# Patient Record
Sex: Female | Born: 1935 | Race: White | Hispanic: No | Smoking: Former smoker
Health system: Southern US, Community
[De-identification: ages and names within clinical notes are randomized; demographics above are authoritative.]

## PROBLEM LIST (undated history)

## (undated) DIAGNOSIS — I2489 Other forms of acute ischemic heart disease: Secondary | ICD-10-CM

## (undated) DIAGNOSIS — E7800 Pure hypercholesterolemia, unspecified: Secondary | ICD-10-CM

## (undated) DIAGNOSIS — I951 Orthostatic hypotension: Secondary | ICD-10-CM

## (undated) DIAGNOSIS — G629 Polyneuropathy, unspecified: Secondary | ICD-10-CM

## (undated) DIAGNOSIS — D649 Anemia, unspecified: Secondary | ICD-10-CM

## (undated) DIAGNOSIS — E785 Hyperlipidemia, unspecified: Secondary | ICD-10-CM

## (undated) DIAGNOSIS — I10 Essential (primary) hypertension: Secondary | ICD-10-CM

## (undated) DIAGNOSIS — I509 Heart failure, unspecified: Secondary | ICD-10-CM

## (undated) DIAGNOSIS — I248 Other forms of acute ischemic heart disease: Secondary | ICD-10-CM

## (undated) DIAGNOSIS — E876 Hypokalemia: Secondary | ICD-10-CM

## (undated) DIAGNOSIS — M810 Age-related osteoporosis without current pathological fracture: Secondary | ICD-10-CM

## (undated) HISTORY — PX: TONSILLECTOMY: SUR1361

## (undated) HISTORY — DX: Other forms of acute ischemic heart disease: I24.8

## (undated) HISTORY — DX: Hyperlipidemia, unspecified: E78.5

## (undated) HISTORY — DX: Polyneuropathy, unspecified: G62.9

## (undated) HISTORY — DX: Orthostatic hypotension: I95.1

## (undated) HISTORY — DX: Hypokalemia: E87.6

## (undated) HISTORY — DX: Age-related osteoporosis without current pathological fracture: M81.0

## (undated) HISTORY — DX: Essential (primary) hypertension: I10

## (undated) HISTORY — DX: Pure hypercholesterolemia, unspecified: E78.00

## (undated) HISTORY — DX: Other forms of acute ischemic heart disease: I24.89

## (undated) HISTORY — DX: Anemia, unspecified: D64.9

---

## 1998-05-22 ENCOUNTER — Other Ambulatory Visit: Admission: RE | Admit: 1998-05-22 | Discharge: 1998-05-22 | Payer: Self-pay | Admitting: *Deleted

## 1998-11-05 ENCOUNTER — Encounter: Payer: Self-pay | Admitting: *Deleted

## 1998-11-05 ENCOUNTER — Ambulatory Visit (HOSPITAL_COMMUNITY): Admission: RE | Admit: 1998-11-05 | Discharge: 1998-11-05 | Payer: Self-pay | Admitting: *Deleted

## 1999-06-01 ENCOUNTER — Other Ambulatory Visit: Admission: RE | Admit: 1999-06-01 | Discharge: 1999-06-01 | Payer: Self-pay | Admitting: *Deleted

## 2000-01-27 ENCOUNTER — Encounter: Payer: Self-pay | Admitting: *Deleted

## 2000-01-27 ENCOUNTER — Ambulatory Visit (HOSPITAL_COMMUNITY): Admission: RE | Admit: 2000-01-27 | Discharge: 2000-01-27 | Payer: Self-pay | Admitting: *Deleted

## 2001-05-03 ENCOUNTER — Encounter: Payer: Self-pay | Admitting: *Deleted

## 2001-05-04 ENCOUNTER — Ambulatory Visit (HOSPITAL_COMMUNITY): Admission: RE | Admit: 2001-05-04 | Discharge: 2001-05-04 | Payer: Self-pay | Admitting: *Deleted

## 2003-01-10 ENCOUNTER — Ambulatory Visit (HOSPITAL_COMMUNITY): Admission: RE | Admit: 2003-01-10 | Discharge: 2003-01-10 | Payer: Self-pay | Admitting: *Deleted

## 2003-01-10 ENCOUNTER — Encounter: Payer: Self-pay | Admitting: *Deleted

## 2005-10-26 ENCOUNTER — Encounter: Admission: RE | Admit: 2005-10-26 | Discharge: 2005-10-26 | Payer: Self-pay | Admitting: *Deleted

## 2013-09-22 ENCOUNTER — Observation Stay (HOSPITAL_COMMUNITY)
Admission: EM | Admit: 2013-09-22 | Discharge: 2013-09-25 | Disposition: A | Discharge status: Home / Self Care | Payer: PRIVATE HEALTH INSURANCE | Attending: Internal Medicine | Admitting: Internal Medicine

## 2013-09-22 ENCOUNTER — Emergency Department (HOSPITAL_COMMUNITY): Payer: PRIVATE HEALTH INSURANCE

## 2013-09-22 ENCOUNTER — Encounter (HOSPITAL_COMMUNITY): Payer: Self-pay | Admitting: Emergency Medicine

## 2013-09-22 DIAGNOSIS — R079 Chest pain, unspecified: Secondary | ICD-10-CM | POA: Insufficient documentation

## 2013-09-22 DIAGNOSIS — I5043 Acute on chronic combined systolic (congestive) and diastolic (congestive) heart failure: Secondary | ICD-10-CM | POA: Insufficient documentation

## 2013-09-22 DIAGNOSIS — R05 Cough: Secondary | ICD-10-CM | POA: Insufficient documentation

## 2013-09-22 DIAGNOSIS — R509 Fever, unspecified: Secondary | ICD-10-CM | POA: Diagnosis present

## 2013-09-22 DIAGNOSIS — I080 Rheumatic disorders of both mitral and aortic valves: Secondary | ICD-10-CM | POA: Insufficient documentation

## 2013-09-22 DIAGNOSIS — E876 Hypokalemia: Secondary | ICD-10-CM | POA: Diagnosis not present

## 2013-09-22 DIAGNOSIS — R059 Cough, unspecified: Secondary | ICD-10-CM | POA: Insufficient documentation

## 2013-09-22 DIAGNOSIS — Z87891 Personal history of nicotine dependence: Secondary | ICD-10-CM | POA: Insufficient documentation

## 2013-09-22 DIAGNOSIS — J111 Influenza due to unidentified influenza virus with other respiratory manifestations: Secondary | ICD-10-CM

## 2013-09-22 DIAGNOSIS — I509 Heart failure, unspecified: Secondary | ICD-10-CM

## 2013-09-22 DIAGNOSIS — E860 Dehydration: Secondary | ICD-10-CM

## 2013-09-22 DIAGNOSIS — I951 Orthostatic hypotension: Secondary | ICD-10-CM | POA: Diagnosis not present

## 2013-09-22 DIAGNOSIS — I248 Other forms of acute ischemic heart disease: Secondary | ICD-10-CM | POA: Diagnosis not present

## 2013-09-22 DIAGNOSIS — I5042 Chronic combined systolic (congestive) and diastolic (congestive) heart failure: Secondary | ICD-10-CM

## 2013-09-22 DIAGNOSIS — D649 Anemia, unspecified: Secondary | ICD-10-CM | POA: Diagnosis not present

## 2013-09-22 DIAGNOSIS — J09X1 Influenza due to identified novel influenza A virus with pneumonia: Secondary | ICD-10-CM

## 2013-09-22 DIAGNOSIS — R748 Abnormal levels of other serum enzymes: Secondary | ICD-10-CM | POA: Insufficient documentation

## 2013-09-22 DIAGNOSIS — I2489 Other forms of acute ischemic heart disease: Secondary | ICD-10-CM

## 2013-09-22 DIAGNOSIS — I700 Atherosclerosis of aorta: Secondary | ICD-10-CM | POA: Insufficient documentation

## 2013-09-22 HISTORY — DX: Heart failure, unspecified: I50.9

## 2013-09-22 LAB — CBC WITH DIFFERENTIAL/PLATELET
Basophils Absolute: 0 K/uL (ref 0.0–0.1)
Basophils Relative: 0 % (ref 0–1)
Eosinophils Absolute: 0 10*3/uL (ref 0.0–0.7)
Eosinophils Relative: 0 % (ref 0–5)
HCT: 32.6 % — ABNORMAL LOW (ref 36.0–46.0)
Hemoglobin: 10.8 g/dL — ABNORMAL LOW (ref 12.0–15.0)
Lymphocytes Relative: 6 % — ABNORMAL LOW (ref 12–46)
Lymphs Abs: 0.3 10*3/uL — ABNORMAL LOW (ref 0.7–4.0)
MCH: 30.4 pg (ref 26.0–34.0)
MCHC: 33.1 g/dL (ref 30.0–36.0)
MCV: 91.8 fL (ref 78.0–100.0)
Monocytes Absolute: 0.7 10*3/uL (ref 0.1–1.0)
Monocytes Relative: 13 % — ABNORMAL HIGH (ref 3–12)
Neutro Abs: 4.1 K/uL (ref 1.7–7.7)
Neutrophils Relative %: 81 % — ABNORMAL HIGH (ref 43–77)
Platelets: 158 10*3/uL (ref 150–400)
RBC: 3.55 MIL/uL — ABNORMAL LOW (ref 3.87–5.11)
RDW: 13.8 % (ref 11.5–15.5)
WBC: 5 K/uL (ref 4.0–10.5)

## 2013-09-22 LAB — URINALYSIS, ROUTINE W REFLEX MICROSCOPIC
Bilirubin Urine: NEGATIVE
Glucose, UA: NEGATIVE mg/dL
Hgb urine dipstick: NEGATIVE
Ketones, ur: NEGATIVE mg/dL
Leukocytes, UA: NEGATIVE
Nitrite: NEGATIVE
Protein, ur: NEGATIVE mg/dL
Specific Gravity, Urine: 1.014 (ref 1.005–1.030)
Urobilinogen, UA: 0.2 mg/dL (ref 0.0–1.0)
pH: 6 (ref 5.0–8.0)

## 2013-09-22 LAB — COMPREHENSIVE METABOLIC PANEL WITH GFR
AST: 14 U/L (ref 0–37)
Albumin: 3 g/dL — ABNORMAL LOW (ref 3.5–5.2)
CO2: 29 meq/L (ref 19–32)
Chloride: 98 meq/L (ref 96–112)
Sodium: 137 meq/L (ref 135–145)
Total Bilirubin: 0.4 mg/dL (ref 0.3–1.2)

## 2013-09-22 LAB — COMPREHENSIVE METABOLIC PANEL
ALT: 5 U/L (ref 0–35)
Alkaline Phosphatase: 61 U/L (ref 39–117)
BUN: 19 mg/dL (ref 6–23)
Calcium: 8.4 mg/dL (ref 8.4–10.5)
Creatinine, Ser: 0.98 mg/dL (ref 0.50–1.10)
GFR calc Af Amer: 56 mL/min — ABNORMAL LOW (ref >90)
GFR calc non Af Amer: 49 mL/min — ABNORMAL LOW (ref >90)
Glucose, Bld: 110 mg/dL — ABNORMAL HIGH (ref 70–99)
Potassium: 3.2 mEq/L — ABNORMAL LOW (ref 3.5–5.1)
Total Protein: 6.3 g/dL (ref 6.0–8.3)

## 2013-09-22 LAB — CG4 I-STAT (LACTIC ACID): Lactic Acid, Venous: 0.73 mmol/L (ref 0.5–2.2)

## 2013-09-22 LAB — PRO B NATRIURETIC PEPTIDE: Pro B Natriuretic peptide (BNP): 17493 pg/mL — ABNORMAL HIGH (ref 0–450)

## 2013-09-22 MED ORDER — SODIUM CHLORIDE 0.9 % IV BOLUS (SEPSIS)
250.0000 mL | Freq: Once | INTRAVENOUS | Status: AC
  Administered 2013-09-22: 250 mL via INTRAVENOUS

## 2013-09-22 MED ORDER — SODIUM CHLORIDE 0.9 % IV BOLUS (SEPSIS)
500.0000 mL | Freq: Once | INTRAVENOUS | Status: AC
  Administered 2013-09-22: 500 mL via INTRAVENOUS

## 2013-09-22 MED ORDER — IPRATROPIUM BROMIDE 0.02 % IN SOLN
0.5000 mg | Freq: Once | RESPIRATORY_TRACT | Status: AC
  Administered 2013-09-22: 0.5 mg via RESPIRATORY_TRACT
  Filled 2013-09-22: qty 2.5

## 2013-09-22 MED ORDER — ALBUTEROL (5 MG/ML) CONTINUOUS INHALATION SOLN
10.0000 mg/h | INHALATION_SOLUTION | Freq: Once | RESPIRATORY_TRACT | Status: AC
Start: 2013-09-22 — End: 2013-09-22
  Administered 2013-09-22: 10 mg/h via RESPIRATORY_TRACT
  Filled 2013-09-22: qty 20

## 2013-09-22 MED ORDER — ACETAMINOPHEN 325 MG PO TABS
650.0000 mg | ORAL_TABLET | Freq: Once | ORAL | Status: AC
  Administered 2013-09-22: 650 mg via ORAL
  Filled 2013-09-22: qty 2

## 2013-09-22 NOTE — ED Provider Notes (Addendum)
CSN: [id number]     Arrival date & time 09/22/13  2000 History   First MD Initiated Contact with Patient 09/22/13 2040     Chief Complaint  Patient presents with  . Weakness  . Fever  . Cough   (Consider location/radiation/quality/duration/timing/severity/associated sxs/prior Treatment) Patient is a 79 y.o. female presenting with weakness, fever, and cough. The history is provided by the patient, a caregiver and the EMS personnel.  Weakness This is a new problem. The current episode started 3 to 5 hours ago. The problem occurs constantly. The problem has been gradually improving. Pertinent negatives include no chest pain, no abdominal pain, no headaches and no shortness of breath.  Fever Max temp prior to arrival:  102 Temp source:  Oral Severity:  Moderate Onset quality:  Sudden Duration:  4 hours Timing:  Constant Chronicity:  New Relieved by:  Acetaminophen Worsened by:  Nothing tried Associated symptoms: chills and cough   Associated symptoms: no chest pain, no congestion, no diarrhea, no headaches, no myalgias, no nausea, no rash, no sore throat and no vomiting   Cough:    Cough characteristics:  Non-productive and hacking   Sputum characteristics:  Nondescript   Severity:  Moderate   Onset quality:  Gradual   Duration:  1 day   Timing:  Constant   Chronicity:  New Risk factors: sick contacts   Risk factors: no immunosuppression   Cough Associated symptoms: chills and fever   Associated symptoms: no chest pain, no diaphoresis, no headaches, no myalgias, no rash, no shortness of breath and no sore throat     Past Medical History  Diagnosis Date  . CHF (congestive heart failure)    History reviewed. No pertinent past surgical history. History reviewed. No pertinent family history. History  Substance Use Topics  . Smoking status: Former Games developer  . Smokeless tobacco: Not on file  . Alcohol Use: No   OB History   Grav Para Term Preterm Abortions TAB SAB Ect Mult  Living                 Review of Systems  Constitutional: Positive for fever, chills and fatigue. Negative for diaphoresis.  HENT: Positive for sneezing. Negative for congestion and sore throat.   Respiratory: Positive for cough. Negative for shortness of breath.   Cardiovascular: Negative for chest pain.  Gastrointestinal: Negative for nausea, vomiting, abdominal pain and diarrhea.  Musculoskeletal: Negative for myalgias and neck pain.  Skin: Negative for rash.  Neurological: Positive for weakness. Negative for syncope and headaches.    Allergies  Review of patient's allergies indicates no known allergies.  Home Medications   Current Outpatient Rx  Name  Route  Sig  Dispense  Refill  . aspirin EC 81 MG tablet   Oral   Take 81 mg by mouth daily.         . cephALEXin (KEFLEX) 250 MG capsule   Oral   Take 250 mg by mouth at bedtime.          . cholecalciferol (VITAMIN D) 1000 UNITS tablet   Oral   Take 1,000 Units by mouth daily.         . dorzolamide-timolol (COSOPT) 22.3-6.8 MG/ML ophthalmic solution   Both Eyes   Place 1 drop into both eyes 2 (two) times daily.         . furosemide (LASIX) 20 MG tablet   Oral   Take 60 mg by mouth daily.         Marland Kitchen  potassium chloride 20 MEQ/15ML (10%) solution   Oral   Take 20 mEq by mouth daily.          BP 67/37  Pulse 133  Temp(Src) 98.1 F (36.7 C) (Oral)  Resp 23  Ht 5\' 1"  (1.549 m)  Wt 111 lb (50.349 kg)  BMI 20.98 kg/m2  SpO2 90% Physical Exam  Nursing note and vitals reviewed. Constitutional: She appears well-developed and well-nourished. No distress.  HENT:  Head: Atraumatic.  Nose: Nose normal.  Mouth/Throat: Oropharynx is clear and moist. No oropharyngeal exudate.  Eyes: Conjunctivae and EOM are normal.  Neck: Normal range of motion. Neck supple.  Cardiovascular: Normal rate, regular rhythm and intact distal pulses.   Pulmonary/Chest: Effort normal. No respiratory distress. She has no wheezes.  She has no rales.  Abdominal: Soft. She exhibits no distension. There is no tenderness. There is no rebound and no guarding.  Lymphadenopathy:    She has no cervical adenopathy.  Neurological: She is alert. She exhibits normal muscle tone. Coordination normal.  Skin: Skin is warm and dry. No rash noted. She is not diaphoretic.  Psychiatric: She has a normal mood and affect. Her behavior is normal. Thought content normal. She exhibits abnormal recent memory.    ED Course  Procedures (including critical care time) Labs Review Labs Reviewed  CBC WITH DIFFERENTIAL - Abnormal; Notable for the following:    RBC 3.55 (*)    Hemoglobin 10.8 (*)    HCT 32.6 (*)    Neutrophils Relative % 81 (*)    Lymphocytes Relative 6 (*)    Lymphs Abs 0.3 (*)    Monocytes Relative 13 (*)    All other components within normal limits  COMPREHENSIVE METABOLIC PANEL - Abnormal; Notable for the following:    Potassium 3.2 (*)    Glucose, Bld 110 (*)    Albumin 3.0 (*)    GFR calc non Af Amer 49 (*)    GFR calc Af Amer 56 (*)    All other components within normal limits  PRO B NATRIURETIC PEPTIDE - Abnormal; Notable for the following:    Pro B Natriuretic peptide (BNP) 17493.0 (*)    All other components within normal limits  URINE CULTURE  CULTURE, BLOOD (ROUTINE X 2)  CULTURE, BLOOD (ROUTINE X 2)  URINALYSIS, ROUTINE W REFLEX MICROSCOPIC  INFLUENZA PANEL BY PCR  CG4 I-STAT (LACTIC ACID)   Imaging Review Dg Chest Port 1 View  09/22/2013   CLINICAL DATA:  Cough and fever.  EXAM: PORTABLE CHEST - 1 VIEW  COMPARISON:  05/17/2013  FINDINGS: Normal heart size and pulmonary vascularity. Diffuse interstitial fibrosis and bronchiectasis. Calcification and pleural thickening in the apices. Findings appear similar to previous study in suggest chronic fibrosis. No focal airspace disease or consolidation. No blunting of costophrenic angles. No pneumothorax. Calcification of the aorta.  IMPRESSION: Stable appearance  of chronic fibrosis and bronchiectasis in the lungs. No superimposed acute infiltration.   Electronically Signed   By: Burman Nieves M.D.   On: 09/22/2013 21:23    EKG Interpretation   At time 20:21, sinus rhythm at rate 96, LBBB, right atrial enlargement.  Abn ECG.        RA sat is 90% and I interpret to be inadequate, with [place] O2, increased to 95%.  11:09 PM RA sat continues to be low, HR is now in the 120's, could be due to albuterol use, however wide PP with systolic 108, diastolic 34.  Will give additional IVF  bolus and reassess.  Pt's BNP is elevated, hwoever no frank edema on CXR.  Given continued abn VS, likely pt will need admission.  11:21 PM With orthostatic testing, pt is hypotensive, still tachycardic, will need continued gently IVF hydration, O2 supplement, admission.  Influenza PCR sent, will discuss with [facility].  11:37 PM Pt has a living will, pt does not wish defib, CPR, intubation.  MDM   1. Dehydration   2. Influenza-like illness   3. CHF (congestive heart failure)   4. Orthostatic hypotension      Pt with fever, rather sudden onset, profound weakness and coughing, some features consistent with influenza.  Pt has a h/o CHF, no DM.  Pt quit smoking many years ago.  With mild hypoexmia, will get CXR< blood cultures, BNP and treat with nebulizer treatmnet and continue to monitor.  Will watch BP carefully along with lactic acid to monitor for worsening sepsis.  Pt meets SIRS criteria.      Gavin Pound. Oletta Lamas, MD 09/22/13 2322  Gavin Pound. Oletta Lamas, MD 09/22/13 2337

## 2013-09-22 NOTE — ED Notes (Signed)
Bed: RESB Expected date: 09/22/13 Expected time: 7:53 PM Means of arrival: Ambulance Comments: 79 yr old fever

## 2013-09-22 NOTE — ED Notes (Signed)
Pt arrived to the ED with a complaint of generalized weakness and fever.  Pt is also complaining of a cough.  Pt is from home.  Pt has had a fever but tylenol and motrin has not been consistently given.  Per EMS her blood pressure was WNL supine but when she was sat up she became orthostatic and blood pressure fell into the 70's palpated.  Pt has been given 250 mL of NS in route.

## 2013-09-23 ENCOUNTER — Encounter (HOSPITAL_COMMUNITY): Payer: Self-pay | Admitting: Internal Medicine

## 2013-09-23 ENCOUNTER — Inpatient Hospital Stay (HOSPITAL_COMMUNITY): Payer: PRIVATE HEALTH INSURANCE

## 2013-09-23 DIAGNOSIS — E860 Dehydration: Secondary | ICD-10-CM

## 2013-09-23 DIAGNOSIS — I509 Heart failure, unspecified: Secondary | ICD-10-CM

## 2013-09-23 DIAGNOSIS — I951 Orthostatic hypotension: Secondary | ICD-10-CM | POA: Diagnosis present

## 2013-09-23 DIAGNOSIS — J09X1 Influenza due to identified novel influenza A virus with pneumonia: Secondary | ICD-10-CM | POA: Diagnosis not present

## 2013-09-23 DIAGNOSIS — R509 Fever, unspecified: Secondary | ICD-10-CM | POA: Diagnosis present

## 2013-09-23 DIAGNOSIS — E876 Hypokalemia: Secondary | ICD-10-CM | POA: Diagnosis present

## 2013-09-23 DIAGNOSIS — D649 Anemia, unspecified: Secondary | ICD-10-CM | POA: Diagnosis present

## 2013-09-23 LAB — CREATININE, SERUM
Creatinine, Ser: 1 mg/dL (ref 0.50–1.10)
GFR calc Af Amer: 55 mL/min — ABNORMAL LOW (ref >90)
GFR calc non Af Amer: 47 mL/min — ABNORMAL LOW (ref >90)

## 2013-09-23 LAB — TROPONIN I
Troponin I: <0.3 ng/mL (ref <0.30)
Troponin I: <0.3 ng/mL (ref <0.30)
Troponin I: 0.51 ng/mL (ref <0.30)
Troponin I: 1.46 ng/mL (ref <0.30)

## 2013-09-23 LAB — CBC
HCT: 34.4 % — ABNORMAL LOW (ref 36.0–46.0)
Hemoglobin: 11.3 g/dL — ABNORMAL LOW (ref 12.0–15.0)
RBC: 3.73 MIL/uL — ABNORMAL LOW (ref 3.87–5.11)
WBC: 6.1 10*3/uL (ref 4.0–10.5)

## 2013-09-23 LAB — COMPREHENSIVE METABOLIC PANEL
ALT: 6 U/L (ref 0–35)
Albumin: 3.1 g/dL — ABNORMAL LOW (ref 3.5–5.2)
BUN: 20 mg/dL (ref 6–23)
Calcium: 8.4 mg/dL (ref 8.4–10.5)
Creatinine, Ser: 0.92 mg/dL (ref 0.50–1.10)
GFR calc Af Amer: 61 mL/min — ABNORMAL LOW (ref >90)
GFR calc non Af Amer: 52 mL/min — ABNORMAL LOW (ref >90)
Glucose, Bld: 105 mg/dL — ABNORMAL HIGH (ref 70–99)
Sodium: 135 mEq/L (ref 135–145)
Total Protein: 6.6 g/dL (ref 6.0–8.3)

## 2013-09-23 LAB — PHOSPHORUS: Phosphorus: 4 mg/dL (ref 2.3–4.6)

## 2013-09-23 LAB — RETICULOCYTES
Retic Count, Absolute: 28 10*3/uL (ref 19.0–186.0)
Retic Ct Pct: 0.8 % (ref 0.4–3.1)

## 2013-09-23 LAB — MAGNESIUM: Magnesium: 1.9 mg/dL (ref 1.5–2.5)

## 2013-09-23 LAB — INFLUENZA PANEL BY PCR (TYPE A & B): Influenza A By PCR: POSITIVE — AB

## 2013-09-23 LAB — FERRITIN: Ferritin: 86 ng/mL (ref 10–291)

## 2013-09-23 LAB — IRON AND TIBC: UIBC: 203 ug/dL (ref 125–400)

## 2013-09-23 MED ORDER — POTASSIUM CHLORIDE 20 MEQ/15ML (10%) PO LIQD
20.0000 meq | Freq: Every day | ORAL | Status: DC
  Administered 2013-09-23 – 2013-09-25 (×3): 20 meq via ORAL
  Filled 2013-09-23 (×3): qty 15

## 2013-09-23 MED ORDER — ONDANSETRON HCL 4 MG/2ML IJ SOLN
4.0000 mg | Freq: Four times a day (QID) | INTRAMUSCULAR | Status: DC | PRN
  Administered 2013-09-23: 07:00:00 4 mg via INTRAVENOUS
  Filled 2013-09-23: qty 2

## 2013-09-23 MED ORDER — ACETAMINOPHEN 325 MG PO TABS
650.0000 mg | ORAL_TABLET | Freq: Four times a day (QID) | ORAL | Status: DC | PRN
  Administered 2013-09-23: 650 mg via ORAL
  Filled 2013-09-23: qty 2

## 2013-09-23 MED ORDER — SODIUM CHLORIDE 0.9 % IV SOLN: Freq: Once | INTRAVENOUS | Status: DC

## 2013-09-23 MED ORDER — ACETAMINOPHEN 650 MG RE SUPP: 650.0000 mg | Freq: Four times a day (QID) | RECTAL | Status: DC | PRN

## 2013-09-23 MED ORDER — HYDROCODONE-ACETAMINOPHEN 5-325 MG PO TABS: 1.0000 | ORAL_TABLET | ORAL | Status: DC | PRN

## 2013-09-23 MED ORDER — ENOXAPARIN SODIUM 30 MG/0.3ML ~~LOC~~ SOLN
30.0000 mg | SUBCUTANEOUS | Status: DC
  Administered 2013-09-23 – 2013-09-24 (×2): 30 mg via SUBCUTANEOUS
  Filled 2013-09-23 (×3): qty 0.3

## 2013-09-23 MED ORDER — POTASSIUM CHLORIDE CRYS ER 20 MEQ PO TBCR
20.0000 meq | EXTENDED_RELEASE_TABLET | Freq: Once | ORAL | Status: AC
  Administered 2013-09-23: 20 meq via ORAL
  Filled 2013-09-23: qty 1

## 2013-09-23 MED ORDER — ENSURE COMPLETE PO LIQD: 237.0000 mL | Freq: Two times a day (BID) | ORAL | Status: DC | PRN

## 2013-09-23 MED ORDER — ASPIRIN EC 81 MG PO TBEC
81.0000 mg | DELAYED_RELEASE_TABLET | Freq: Every day | ORAL | Status: DC
  Administered 2013-09-23 – 2013-09-25 (×3): 81 mg via ORAL
  Filled 2013-09-23 (×3): qty 1

## 2013-09-23 MED ORDER — SODIUM CHLORIDE 0.9 % IV SOLN
25.0000 mg | Freq: Once | INTRAVENOUS | Status: DC
  Filled 2013-09-23: qty 0.5

## 2013-09-23 MED ORDER — SODIUM CHLORIDE 0.9 % IV SOLN
50.0000 mg | Freq: Once | INTRAVENOUS | Status: DC
  Filled 2013-09-23: qty 1

## 2013-09-23 MED ORDER — SODIUM CHLORIDE 0.9 % IV SOLN: INTRAVENOUS | Status: DC

## 2013-09-23 MED ORDER — PANTOPRAZOLE SODIUM 40 MG PO TBEC
40.0000 mg | DELAYED_RELEASE_TABLET | Freq: Every day | ORAL | Status: DC
  Administered 2013-09-23 – 2013-09-25 (×3): 40 mg via ORAL
  Filled 2013-09-23 (×4): qty 1

## 2013-09-23 MED ORDER — ONDANSETRON HCL 4 MG PO TABS: 4.0000 mg | ORAL_TABLET | Freq: Four times a day (QID) | ORAL | Status: DC | PRN

## 2013-09-23 MED ORDER — CEPHALEXIN 250 MG PO CAPS
250.0000 mg | ORAL_CAPSULE | Freq: Every day | ORAL | Status: DC
  Administered 2013-09-23 – 2013-09-24 (×3): 250 mg via ORAL
  Filled 2013-09-23 (×4): qty 1

## 2013-09-23 MED ORDER — OSELTAMIVIR PHOSPHATE 75 MG PO CAPS
75.0000 mg | ORAL_CAPSULE | Freq: Two times a day (BID) | ORAL | Status: DC
  Administered 2013-09-23 (×2): 75 mg via ORAL
  Filled 2013-09-23 (×3): qty 1

## 2013-09-23 MED ORDER — FUROSEMIDE 10 MG/ML IJ SOLN: 20.0000 mg | Freq: Once | INTRAMUSCULAR | Status: DC

## 2013-09-23 MED ORDER — DORZOLAMIDE HCL-TIMOLOL MAL 2-0.5 % OP SOLN
1.0000 [drp] | Freq: Two times a day (BID) | OPHTHALMIC | Status: DC
  Administered 2013-09-23 – 2013-09-25 (×5): 1 [drp] via OPHTHALMIC
  Filled 2013-09-23: qty 10

## 2013-09-23 MED ORDER — DOCUSATE SODIUM 100 MG PO CAPS
100.0000 mg | ORAL_CAPSULE | Freq: Two times a day (BID) | ORAL | Status: DC
  Administered 2013-09-23 – 2013-09-25 (×5): 100 mg via ORAL
  Filled 2013-09-23 (×6): qty 1

## 2013-09-23 MED ORDER — POTASSIUM CHLORIDE CRYS ER 20 MEQ PO TBCR
20.0000 meq | EXTENDED_RELEASE_TABLET | Freq: Two times a day (BID) | ORAL | Status: DC
  Filled 2013-09-23 (×2): qty 1

## 2013-09-23 MED ORDER — SODIUM CHLORIDE 0.9 % IJ SOLN
3.0000 mL | Freq: Two times a day (BID) | INTRAMUSCULAR | Status: DC
  Administered 2013-09-23 – 2013-09-24 (×3): 3 mL via INTRAVENOUS

## 2013-09-23 MED ORDER — OSELTAMIVIR PHOSPHATE 75 MG PO CAPS
75.0000 mg | ORAL_CAPSULE | Freq: Every day | ORAL | Status: DC
  Administered 2013-09-24: 75 mg via ORAL
  Filled 2013-09-23: qty 1

## 2013-09-23 MED ORDER — SODIUM CHLORIDE 0.9 % IV BOLUS (SEPSIS)
250.0000 mL | Freq: Once | INTRAVENOUS | Status: AC
  Administered 2013-09-23: 22:00:00 250 mL via INTRAVENOUS

## 2013-09-23 NOTE — Consult Note (Signed)
Cardiology Consultation Note  Patient ID: Yolanda Cannon, MRN: [id number], DOB/AGE: Dec 31, 1918 79 y.o. Admit date: 09/22/2013   Date of Consult: 09/23/2013 Primary Physician: Gretel Acre, MD Primary Cardiologist: New to [facility]  Chief Complaint: fever, weakness Reason for Consult: elevated troponin, CHF  HPI:Yolanda Cannon is a 79 y/o F with reported history of prior CHF who presented to [facility] last night with progressive fatigue, cough, and fever up to 100.3. She was positive for the flu A. pBNP was 17k on admission but she was actually felt to be on the dry side with severe orthostasis on admission (BP fell to the 70's per EMS when sitting up). She received gentle IV hydration. Initial troponin neg x2 then 3rd set 0.51. CXR suggestive of new changes in the left base likely related to acute superimposed infiltrate. She was started on Tamiflu and supplemental oxygen. She did receive the flu shot this year.  She has been documented to be quite orthostatic upon standing.  Past Medical History  Diagnosis Date  . CHF (congestive heart failure)       Most Recent Cardiac Studies: No prior   Surgical History: History reviewed. No pertinent past surgical history.   Home Meds: Prior to Admission medications   Medication Sig Start Date End Date Taking? Authorizing Provider  aspirin EC 81 MG tablet Take 81 mg by mouth daily.   Yes Historical Provider, MD  cephALEXin (KEFLEX) 250 MG capsule Take 250 mg by mouth at bedtime.  09/20/13  Yes Historical Provider, MD  cholecalciferol (VITAMIN D) 1000 UNITS tablet Take 1,000 Units by mouth daily.   Yes Historical Provider, MD  dorzolamide-timolol (COSOPT) 22.3-6.8 MG/ML ophthalmic solution Place 1 drop into both eyes 2 (two) times daily.   Yes Historical Provider, MD  furosemide (LASIX) 20 MG tablet Take 60 mg by mouth daily.   Yes Historical Provider, MD  potassium chloride 20 MEQ/15ML (10%) solution Take 20 mEq by mouth daily.   Yes Historical Provider,  MD    Inpatient Medications:  . sodium chloride   Intravenous Once  . aspirin EC  81 mg Oral Daily  . cephALEXin  250 mg Oral QHS  . docusate sodium  100 mg Oral BID  . dorzolamide-timolol  1 drop Both Eyes BID  . enoxaparin (LOVENOX) injection  30 mg Subcutaneous Q24H  . furosemide  20 mg Intravenous Once  . [START ON 09/24/2013] oseltamivir  75 mg Oral Daily  . pantoprazole  40 mg Oral Q0600  . potassium chloride  20 mEq Oral Daily  . sodium chloride  3 mL Intravenous Q12H      Allergies: No Known Allergies  History   Social History  . Marital Status: Widowed    Spouse Name: N/A    Number of Children: N/A  . Years of Education: N/A   Occupational History  . Not on file.   Social History Main Topics  . Smoking status: Former Games developer  . Smokeless tobacco: Not on file  . Alcohol Use: No  . Drug Use: No  . Sexual Activity: No   Other Topics Concern  . Not on file   Social History Narrative  . No narrative on file     Family History  Problem Relation Age of Onset  . Other      family history unknown to patient     Review of Systems:  Sleeping and rouses but is unable to provide ROS  Labs:  Recent Labs  09/23/13 0119 09/23/13 6295  09/23/13 1315  TROPONINI <0.30 <0.30 0.51*   Lab Results  Component Value Date   WBC 6.1 09/23/2013   HGB 11.3* 09/23/2013   HCT 34.4* 09/23/2013   MCV 92.2 09/23/2013   PLT 155 09/23/2013    Recent Labs Lab 09/23/13 0723  NA 135  K 3.2*  CL 97  CO2 27  BUN 20  CREATININE 0.92  CALCIUM 8.4  PROT 6.6  BILITOT 0.3  ALKPHOS 64  ALT 6  AST 17  GLUCOSE 105*   No results found for this basename: CHOL, HDL, LDLCALC, TRIG   No results found for this basename: DDIMER    Radiology/Studies:  Dg Chest 2 View  09/23/2013   CLINICAL DATA:  Cough and fever  EXAM: CHEST  2 VIEW  COMPARISON:  09/22/2013  FINDINGS: Cardiac shadow is stable. Diffuse fibrotic changes are again identified. New density is noted in the  left lung base which may represent a very early superimposed infiltrate. No acute bony abnormality is noted.  IMPRESSION: New changes in the left base likely related to acute superimposed infiltrate.   Electronically Signed   By: Alcide Clever M.D.   On: 09/23/2013 07:59   Dg Chest Port 1 View  09/22/2013   CLINICAL DATA:  Cough and fever.  EXAM: PORTABLE CHEST - 1 VIEW  COMPARISON:  05/17/2013  FINDINGS: Normal heart size and pulmonary vascularity. Diffuse interstitial fibrosis and bronchiectasis. Calcification and pleural thickening in the apices. Findings appear similar to previous study in suggest chronic fibrosis. No focal airspace disease or consolidation. No blunting of costophrenic angles. No pneumothorax. Calcification of the aorta.  IMPRESSION: Stable appearance of chronic fibrosis and bronchiectasis in the lungs. No superimposed acute infiltration.   Electronically Signed   By: Burman Nieves M.D.   On: 09/22/2013 21:23   EKG: sinus tach 114bpm LBBB   Physical Exam: Blood pressure 108/51, pulse 101, temperature 99.3 F (37.4 C), temperature source Oral, resp. rate 20, height 5\' 1"  (1.549 m), weight 110 lb 3.7 oz (50 kg), SpO2 90.00%. General: thin frail and elderly, sleeping but rouses Head: Normocephalic, atraumatic, sclera non-icteric, no xanthomas, nares are without discharge.OP is dry  JVP is flat, neck is supple Lungs: Clear bilaterally to auscultation without wheezes,  Heart: RRR with S1 S2. No murmurs, rubs, or gallops appreciated. Abdomen: Soft, non-tender, non-distended with normoactive bowel sounds. No hepatomegaly. No rebound/guarding. No obvious abdominal masses. Msk:  Strength and tone appear normal for age. Extremities: No clubbing or cyanosis. No edema.  Distal pedal pulses are 2+ and equal bilaterally. Neuro:  Moves all extremities spontaneously. Psych:  Sleeping but rouses, mildly confused   Assessment and Plan:   79 yo WF with the flu.  She is orthostatic and dry  on exam.  She carries a h/o CHF but is not volume overloaded on exam.  Her mildly elevated troponin is likely due to "demand ischemia" related to having the flu.  I do not think that she has an acute coronary syndrome and would not advise anticoagulation or further CV testing given her advanced age.  My recs are to treat supportively.  Hold diuretics and gently hydrate until no longer orthostatic. Echo is pending. She is not a candidate for invasive CV procedures.

## 2013-09-23 NOTE — ED Notes (Signed)
Pt's daughter Joanna Puff [phone](U)  [phone] (H) contact information

## 2013-09-23 NOTE — H&P (Addendum)
PCP:  Gretel Acre, MD    Chief Complaint:  Cough and fever  HPI: Yolanda Cannon is a 79 y.o. female   has a past medical history of CHF (congestive heart failure).   Presented with  Patient states that the past 2-3 days she been not feeling well. Have been progressively getting fatigued. She was having a cough and started to have some low-grade fevers as well. Her family apparently brought in to emergency department where she was found to be severely orthostatic. She was febrile to 100.3. Chest x-ray did not show any evidence of pneumonia July unremarkable. Patient did have some elevation of BNP she has chronic history of CHF but did not appear to be fluid overloaded. On the contrary patient appears to be dehydrated. Hospitalist was called on admission. Family not at bedside. Patient states that she feels much better but not back to normal yet She reports having a flu shot done this year. Reports that her son-in-law has been recently ill Review of Systems:    Pertinent positives include:, Fevers, chills, fatigue,   non-productive cough, Constitutional:  No weight loss, night sweatsweight loss  HEENT:  No headaches, Difficulty swallowing,Tooth/dental problems,Sore throat,  No sneezing, itching, ear ache, nasal congestion, post nasal drip,  Cardio-vascular:  No chest pain, Orthopnea, PND, anasarca, dizziness, palpitations.no Bilateral lower extremity swelling  GI:  No heartburn, indigestion, abdominal pain, nausea, vomiting, diarrhea, change in bowel habits, loss of appetite, melena, blood in stool, hematemesis Resp:  no shortness of breath at rest. No dyspnea on exertion, No excess mucus, no productive cough, No No coughing up of blood.No change in color of mucus.No wheezing. Skin:  no rash or lesions. No jaundice GU:  no dysuria, change in color of urine, no urgency or frequency. No straining to urinate.  No flank pain.  Musculoskeletal:  No joint pain or no joint swelling. No  decreased range of motion. No back pain.  Psych:  No change in mood or affect. No depression or anxiety. No memory loss.  Neuro: no localizing neurological complaints, no tingling, no weakness, no double vision, no gait abnormality, no slurred speech, no confusion  Otherwise ROS are negative except for above, 10 systems were reviewed  Past Medical History: Past Medical History  Diagnosis Date  . CHF (congestive heart failure)    History reviewed. No pertinent past surgical history.   Medications: Prior to Admission medications   Medication Sig Start Date End Date Taking? Authorizing Provider  aspirin EC 81 MG tablet Take 81 mg by mouth daily.   Yes Historical Provider, MD  cephALEXin (KEFLEX) 250 MG capsule Take 250 mg by mouth at bedtime.  09/20/13  Yes Historical Provider, MD  cholecalciferol (VITAMIN D) 1000 UNITS tablet Take 1,000 Units by mouth daily.   Yes Historical Provider, MD  dorzolamide-timolol (COSOPT) 22.3-6.8 MG/ML ophthalmic solution Place 1 drop into both eyes 2 (two) times daily.   Yes Historical Provider, MD  furosemide (LASIX) 20 MG tablet Take 60 mg by mouth daily.   Yes Historical Provider, MD  potassium chloride 20 MEQ/15ML (10%) solution Take 20 mEq by mouth daily.   Yes Historical Provider, MD    Allergies:  No Known Allergies  Social History:  Ambulatory walker Lives at  home alone   reports that she has quit smoking. She does not have any smokeless tobacco history on file. She reports that she does not drink alcohol or use illicit drugs.   Family History: Family history is unknown by  patient.    Physical Exam: Patient Vitals for the past 24 hrs:  BP Temp Temp src Pulse Resp SpO2 Height Weight  09/22/13 2315 - 98.1 F (36.7 C) Oral - - - - -  09/22/13 2313 67/37 mmHg - - 133 - - - -  09/22/13 2310 102/56 mmHg - - 122 - - - -  09/22/13 2307 103/34 mmHg - - 118 - - - -  09/22/13 2300 108/34 mmHg - - 119 23 90 % - -  09/22/13 2230 114/40 mmHg -  - 121 19 93 % - -  09/22/13 2200 107/48 mmHg - - 113 24 100 % - -  09/22/13 2130 95/42 mmHg - - 91 26 100 % - -  09/22/13 2117 - - - - - 97 % - -  09/22/13 2100 96/45 mmHg - - 92 19 97 % - -  09/22/13 2030 99/48 mmHg - - 90 22 97 % - -  09/22/13 2021 110/54 mmHg 100.3 F (37.9 C) Rectal 95 21 93 % 5\' 1"  (1.549 m) 50.349 kg (111 lb)    1. General:  in No Acute distress 2. Psychological: Alert and  Oriented 3. Head/ENT:   Dry Mucous Membranes                          Head Non traumatic, neck supple                          Normal  Dentition 4. SKIN:  decreased Skin turgor,  Skin clean Dry and intact no rash 5. Heart: Rapid but Regular rate and rhythm no Murmur, Rub or gallop 6. Lungs:  no wheezes the location crackles   7. Abdomen: Soft, non-tender, Non distended 8. Lower extremities: no clubbing, cyanosis, or edema 9. Neurologically Grossly intact, moving all 4 extremities equally 10. MSK: Normal range of motion  body mass index is 20.98 kg/(m^2).   Labs on Admission:   Recent Labs  09/22/13 2100  NA 137  K 3.2*  CL 98  CO2 29  GLUCOSE 110*  BUN 19  CREATININE 0.98  CALCIUM 8.4    Recent Labs  09/22/13 2100  AST 14  ALT 5  ALKPHOS 61  BILITOT 0.4  PROT 6.3  ALBUMIN 3.0*   No results found for this basename: LIPASE, AMYLASE,  in the last 72 hours  Recent Labs  09/22/13 2100  WBC 5.0  NEUTROABS 4.1  HGB 10.8*  HCT 32.6*  MCV 91.8  PLT 158   No results found for this basename: CKTOTAL, CKMB, CKMBINDEX, TROPONINI,  in the last 72 hours No results found for this basename: TSH, T4TOTAL, FREET3, T3FREE, THYROIDAB,  in the last 72 hours No results found for this basename: VITAMINB12, FOLATE, FERRITIN, TIBC, IRON, RETICCTPCT,  in the last 72 hours No results found for this basename: HGBA1C    Estimated Creatinine Clearance: 27.6 ml/min (by C-G formula based on Cr of 0.98). ABG No results found for this basename: phart, pco2, po2, hco3, tco2, acidbasedef,  o2sat     No results found for this basename: DDIMER     Other results:  I have pearsonaly reviewed this: ECG REPORT  Rate: 96  Rhythm: Left bundle branch block    UA no evidence of UTI BNP 17493.0 (H)   Cultures: No results found for this basename: sdes, specrequest, cult, reptstatus       Radiological Exams on  Admission: Dg Chest Port 1 View  09/22/2013   CLINICAL DATA:  Cough and fever.  EXAM: PORTABLE CHEST - 1 VIEW  COMPARISON:  05/17/2013  FINDINGS: Normal heart size and pulmonary vascularity. Diffuse interstitial fibrosis and bronchiectasis. Calcification and pleural thickening in the apices. Findings appear similar to previous study in suggest chronic fibrosis. No focal airspace disease or consolidation. No blunting of costophrenic angles. No pneumothorax. Calcification of the aorta.  IMPRESSION: Stable appearance of chronic fibrosis and bronchiectasis in the lungs. No superimposed acute infiltration.   Electronically Signed   By: Burman Nieves M.D.   On: 09/22/2013 21:23    Chart has been reviewed  Assessment/Plan  79 year old female with past history of CHF presents with fever and cough possible viral illness  Present on Admission:  . Febrile illness - this point is no indication for pneumonia. Although influenza is a possibility. Need to followup influenza PCR. Given advanced age and the fact that she is right on the cusp of her symptoms initiation will start Tamiflu  . Orthostatic hypotension - gentle IV hydration given history of CHF  . Dehydration - IV hydration . Hypokalemia - will replace  . Anemia - obtain anemia panel, no indication for transfusion Hx of CHF -  currently appears to be stable will hold Lasix as patient appears to be dehydrated and orthostatic. As her fluid status improves this probably needs to be restarted to avoid fluid overload   Prophylaxis:  Lovenox   CODE STATUS: DNR//DNI  Other plan as per orders.  I have spent a total of  55 min on this admission  Levern Kalka 09/23/2013, 1:00 AM

## 2013-09-23 NOTE — Progress Notes (Signed)
INITIAL NUTRITION ASSESSMENT  DOCUMENTATION CODES Per approved criteria  -Not Applicable   INTERVENTION: Provide Ensure Complete BID PRN Provide Ensure Pudding once daily RD to continue to monitor  NUTRITION DIAGNOSIS: Predicted suboptimal energy intake related to decreased appetite as evidenced by pt's report on admission.   Goal: Pt to meet >/= 90% of their estimated nutrition needs   Monitor:  PO intake Weight Labs  Reason for Assessment: Malnutrition Screening Tool, score of 2  79 y.o. female  Admitting Dx: <principal problem not specified>  ASSESSMENT: 79 y.o. female has a past medical history of CHF (congestive heart failure). Patient states that the past 2-3 days she been not feeling well. Have been progressively getting fatigued. She was having a cough and started to have some low-grade fevers as well. Her family apparently brought in to emergency department where she was found to be severely orthostatic.  Pt asleep at time of visit. Upon admission pt reported eating poorly because of a decreased appetite and recent unintentional weight loss. Per RN pt ate fairly well at breakfast but, unknown if pt ate any lunch.   Height: Ht Readings from Last 1 Encounters:  09/23/13 5\' 1"  (1.549 m)    Weight: Wt Readings from Last 1 Encounters:  09/23/13 110 lb 3.7 oz (50 kg)    Ideal Body Weight: 105 lbs  % Ideal Body Weight: 105%  Wt Readings from Last 10 Encounters:  09/23/13 110 lb 3.7 oz (50 kg)    Usual Body Weight: unknown  % Usual Body Weight: NA  BMI:  Body mass index is 20.84 kg/(m^2).  Estimated Nutritional Needs: Kcal: 1200-1400 Protein: 50-60 grams Fluid: 1.2-1.4 L/day  Skin: WDL  Diet Order: Cardiac  EDUCATION NEEDS: -No education needs identified at this time   Intake/Output Summary (Last 24 hours) at 09/23/13 1553 Last data filed at 09/23/13 1413  Gross per 24 hour  Intake 418.75 ml  Output    126 ml  Net 292.75 ml    Last BM:  PTA   Labs:   Recent Labs Lab 09/22/13 2100 09/23/13 0119 09/23/13 0723  NA 137  --  135  K 3.2*  --  3.2*  CL 98  --  97  CO2 29  --  27  BUN 19  --  20  CREATININE 0.98 1.00 0.92  CALCIUM 8.4  --  8.4  MG  --   --  1.9  PHOS  --   --  4.0  GLUCOSE 110*  --  105*    CBG (last 3)  No results found for this basename: GLUCAP,  in the last 72 hours  Scheduled Meds: . aspirin EC  81 mg Oral Daily  . cephALEXin  250 mg Oral QHS  . docusate sodium  100 mg Oral BID  . dorzolamide-timolol  1 drop Both Eyes BID  . enoxaparin (LOVENOX) injection  30 mg Subcutaneous Q24H  . furosemide  20 mg Intravenous Once  . [START ON 09/24/2013] oseltamivir  75 mg Oral Daily  . pantoprazole  40 mg Oral Q0600  . potassium chloride  20 mEq Oral Daily  . sodium chloride  3 mL Intravenous Q12H    Continuous Infusions:   Past Medical History  Diagnosis Date  . CHF (congestive heart failure)     History reviewed. No pertinent past surgical history.  Ian Malkin RD, LDN Inpatient Clinical Dietitian Pager: [phone] After Hours Pager: [phone]

## 2013-09-23 NOTE — Progress Notes (Signed)
[facility] PROGRESS NOTE  Yolanda Cannon ZOX:[id number] DOB: [birth year] DOA: 09/22/2013 PCP: Gretel Acre, MD  Assessment/Plan:  Influenza Positive Tamiflu. Supportive care.  Elevated troponin Troponin 0.5 in the setting of flu and chf exacerbation Likely demand ischemia Continue trending troponin. Cardiology consulted.  CHF with elevated BNP BNP 17, 493.  No previous labs for comparison. Will give one dose of IV lasix  2D echo Strict Is and Os Daily weights  Cardiology consulted.   Hypokalemia Magnesium is 1.9 Will replete potassium orally and check bmet in am.  Iron deficiency. Will prescribe iron on discharge     DVT Prophylaxis:  lovenox  Code Status: DNR / DNI Family Communication: Attempted to call dtr, Joanna Puff but number on face sheet was disconnected. Disposition Plan: PT eval on 12/30.  Inpatient.  Lives at home alone.   Consultants:  cardiology  Procedures:    Antibiotics:    HPI/Subjective: Patient reports she feels constipated.  Can't remember her last bowel movement.  Hasn't been eating much lately.  Her daughter brings her food.  Objective: Filed Vitals:   09/23/13 0658 09/23/13 0813 09/23/13 0814 09/23/13 0817  BP: 117/62 122/64 104/78 117/57  Pulse: 113 117 117 120  Temp: 98.3 F (36.8 C)     TempSrc: Oral     Resp: 20     Height:      Weight:      SpO2: 98% 92% 96% 97%    Intake/Output Summary (Last 24 hours) at 09/23/13 1449 Last data filed at 09/23/13 1413  Gross per 24 hour  Intake 418.75 ml  Output    126 ml  Net 292.75 ml   Filed Weights   09/22/13 2021 09/23/13 0122  Weight: 50.349 kg (111 lb) 50 kg (110 lb 3.7 oz)    Exam: General: Well developed, well nourished, NAD, appears stated age.  Hard of Hearing HEENT:  PERR, EOMI, Anicteic Sclera, MMM. No pharyngeal erythema or exudates  Neck: Supple, no JVD, no masses  Cardiovascular: RRR, S1 S2 auscultated, no rubs, murmurs or gallops.    Respiratory: Clear to auscultation bilaterally with equal chest rise  Abdomen: thin, Soft, nontender, nondistended, + bowel sounds  Extremities: warm dry, 1+ non pitting edema in lower ext bilaterally. Neuro: AAOx3, cranial nerves grossly intact. Strength 5/5 in upper and lower extremities  Skin: Without rashes exudates or nodules.   Psych: Normal affect and demeanor with intact judgement and insight.  Memory loss.       Data Reviewed: Basic Metabolic Panel:  Recent Labs Lab 09/22/13 2100 09/23/13 0119 09/23/13 0723  NA 137  --  135  K 3.2*  --  3.2*  CL 98  --  97  CO2 29  --  27  GLUCOSE 110*  --  105*  BUN 19  --  20  CREATININE 0.98 1.00 0.92  CALCIUM 8.4  --  8.4  MG  --   --  1.9  PHOS  --   --  4.0   Liver Function Tests:  Recent Labs Lab 09/22/13 2100 09/23/13 0723  AST 14 17  ALT 5 6  ALKPHOS 61 64  BILITOT 0.4 0.3  PROT 6.3 6.6  ALBUMIN 3.0* 3.1*   CBC:  Recent Labs Lab 09/22/13 2100 09/23/13 0723  WBC 5.0 6.1  NEUTROABS 4.1  --   HGB 10.8* 11.3*  HCT 32.6* 34.4*  MCV 91.8 92.2  PLT 158 155   Cardiac Enzymes:  Recent Labs Lab 09/23/13 0119 09/23/13  1324 09/23/13 1315  TROPONINI <0.30 <0.30 0.51*   BNP (last 3 results)  Recent Labs  09/22/13 2100  PROBNP 17493.0*     Studies: Dg Chest 2 View  09/23/2013   CLINICAL DATA:  Cough and fever  EXAM: CHEST  2 VIEW  COMPARISON:  09/22/2013  FINDINGS: Cardiac shadow is stable. Diffuse fibrotic changes are again identified. New density is noted in the left lung base which may represent a very early superimposed infiltrate. No acute bony abnormality is noted.  IMPRESSION: New changes in the left base likely related to acute superimposed infiltrate.   Electronically Signed   By: Alcide Clever M.D.   On: 09/23/2013 07:59   Dg Chest Port 1 View  09/22/2013   CLINICAL DATA:  Cough and fever.  EXAM: PORTABLE CHEST - 1 VIEW  COMPARISON:  05/17/2013  FINDINGS: Normal heart size and pulmonary  vascularity. Diffuse interstitial fibrosis and bronchiectasis. Calcification and pleural thickening in the apices. Findings appear similar to previous study in suggest chronic fibrosis. No focal airspace disease or consolidation. No blunting of costophrenic angles. No pneumothorax. Calcification of the aorta.  IMPRESSION: Stable appearance of chronic fibrosis and bronchiectasis in the lungs. No superimposed acute infiltration.   Electronically Signed   By: Burman Nieves M.D.   On: 09/22/2013 21:23    Scheduled Meds: . sodium chloride   Intravenous Once  . aspirin EC  81 mg Oral Daily  . cephALEXin  250 mg Oral QHS  . docusate sodium  100 mg Oral BID  . dorzolamide-timolol  1 drop Both Eyes BID  . enoxaparin (LOVENOX) injection  30 mg Subcutaneous Q24H  . furosemide  20 mg Intravenous Once  . [START ON 09/24/2013] oseltamivir  75 mg Oral Daily  . pantoprazole  40 mg Oral Q0600  . potassium chloride  20 mEq Oral Daily  . sodium chloride  3 mL Intravenous Q12H   Continuous Infusions:   Active Problems:   Orthostatic hypotension   Dehydration   Febrile illness   Hypokalemia   Anemia    Conley Canal [facility] Pager [phone]. If 7PM-7AM, please contact night-coverage at [email or web address], password [facility] 09/23/2013, 2:49 PM  LOS: 1 day

## 2013-09-23 NOTE — Progress Notes (Signed)
Orthostatic Vitals  Lying         122/64    117 Sitting        104/78    117 Standing    117/57     120

## 2013-09-23 NOTE — Evaluation (Signed)
Physical Therapy Evaluation Patient Details Name: Yolanda Cannon MRN: [id number] DOB: June 14, 1919 Today's Date: 09/23/2013 Time: 4098-1191 PT Time Calculation (min): 10 min  PT Assessment / Plan / Recommendation History of Present Illness  Patient states that the past 2-3 days she been not feeling well. Have been progressively getting fatigued. She was having a cough and started to have some low-grade fevers as well. Her family apparently brought in to emergency department where she was found to be severely orthostatic. She was febrile to 100.3. Chest x-ray did not show any evidence of pneumonia July unremarkable. Patient did have some elevation of BNP she has chronic history of CHF but did not appear to be fluid overloaded. On the contrary patient appears to be dehydrated. Hospitalist was called on admission. Family not at bedside. Patient states that she feels much better but not back to normal yet  Clinical Impression  Pt admitted with orthostatic hypotension. Pt currently with functional limitations due to the deficits listed below (see PT Problem List).  Pt will benefit from skilled PT to increase their independence and safety with mobility to allow discharge to the venue listed below.  Pt reports her daughter is available to stay with her upon d/c.     PT Assessment  Patient needs continued PT services    Follow Up Recommendations  Home health PT;Supervision for mobility/OOB    Does the patient have the potential to tolerate intense rehabilitation      Barriers to Discharge        Equipment Recommendations  None recommended by PT    Recommendations for Other Services     Frequency Min 3X/week    Precautions / Restrictions Precautions Precautions: Fall   Pertinent Vitals/Pain At rest: SpO2: 92% HR: 115 During ambulation: SpO2: 89% HR: 126 Post-activity: SpO2:91% HR: 119 Pt remained on 2L O2 [place] throughout session.     Mobility  Bed Mobility Bed Mobility: Not  assessed Details for Bed Mobility Assistance: pt up in recliner on arrival Transfers Transfers: Sit to Stand;Stand to Sit Sit to Stand: 4: Min guard;With upper extremity assist;From chair/3-in-1 Stand to Sit: 4: Min guard;With upper extremity assist;To chair/3-in-1 Details for Transfer Assistance: verbal cues for safe technique Ambulation/Gait Ambulation/Gait Assistance: 4: Min assist Ambulation Distance (Feet): 140 Feet Assistive device: 1 person hand held assist Ambulation/Gait Assistance Details: pt reports she usually uses cane so provided 1 HHA, pt slightly unsteady requiring min assist, may try cane or RW next visit  Gait Pattern: Step-through pattern Gait velocity: decr General Gait Details: maintained 2L O2 ; SpO2 89%-91% during ambulation    Exercises     PT Diagnosis: Difficulty walking;Generalized weakness  PT Problem List: Decreased strength;Decreased mobility;Decreased activity tolerance;Cardiopulmonary status limiting activity PT Treatment Interventions: DME instruction;Functional mobility training;Therapeutic activities;Therapeutic exercise;Patient/family education;Gait training     PT Goals(Current goals can be found in the care plan section) Acute Rehab PT Goals PT Goal Formulation: With patient Time For Goal Achievement: 09/30/13 Potential to Achieve Goals: Good  Visit Information  Last PT Received On: 09/23/13 Assistance Needed: +1 History of Present Illness: Patient states that the past 2-3 days she been not feeling well. Have been progressively getting fatigued. She was having a cough and started to have some low-grade fevers as well. Her family apparently brought in to emergency department where she was found to be severely orthostatic. She was febrile to 100.3. Chest x-ray did not show any evidence of pneumonia July unremarkable. Patient did have some elevation of BNP she  has chronic history of CHF but did not appear to be fluid overloaded. On the contrary  patient appears to be dehydrated. Hospitalist was called on admission. Family not at bedside. Patient states that she feels much better but not back to normal yet       Prior Functioning  Home Living Family/patient expects to be discharged to:: Private residence Living Arrangements: Alone Available Help at Discharge: Family;Available 24 hours/day (states daughter can stay a couple days upon d/c) Type of Home: House Home Access: Stairs to enter [employer] of Steps: 1 Entrance Stairs-Rails: None Home Layout: One level Home Equipment: Walker - 2 wheels;Cane - single point Prior Function Level of Independence: Independent with assistive device(s) Comments: uses cane Communication Communication: No difficulties    Cognition  Cognition Arousal/Alertness: Awake/alert Behavior During Therapy: WFL for tasks assessed/performed Overall Cognitive Status: Within Functional Limits for tasks assessed    Extremity/Trunk Assessment Lower Extremity Assessment Lower Extremity Assessment: Generalized weakness   Balance    End of Session PT - End of Session Equipment Utilized During Treatment: Oxygen Activity Tolerance: Patient limited by fatigue Patient left: in chair;with call bell/phone within reach;with chair alarm set;with nursing/sitter in room  GP     Lavoris Canizales,KATHrine E 09/23/2013, 1:18 PM Zenovia Jarred, PT, DPT 09/23/2013 Pager: [phone]

## 2013-09-24 DIAGNOSIS — E876 Hypokalemia: Secondary | ICD-10-CM

## 2013-09-24 DIAGNOSIS — J09X1 Influenza due to identified novel influenza A virus with pneumonia: Secondary | ICD-10-CM | POA: Diagnosis not present

## 2013-09-24 DIAGNOSIS — J111 Influenza due to unidentified influenza virus with other respiratory manifestations: Secondary | ICD-10-CM

## 2013-09-24 DIAGNOSIS — I059 Rheumatic mitral valve disease, unspecified: Secondary | ICD-10-CM

## 2013-09-24 DIAGNOSIS — R509 Fever, unspecified: Secondary | ICD-10-CM

## 2013-09-24 LAB — CBC
HCT: 31.2 % — ABNORMAL LOW (ref 36.0–46.0)
Hemoglobin: 10 g/dL — ABNORMAL LOW (ref 12.0–15.0)
MCH: 29.8 pg (ref 26.0–34.0)
MCHC: 32.1 g/dL (ref 30.0–36.0)
MCV: 92.9 fL (ref 78.0–100.0)

## 2013-09-24 LAB — BASIC METABOLIC PANEL
BUN: 22 mg/dL (ref 6–23)
CO2: 29 mEq/L (ref 19–32)
Chloride: 99 mEq/L (ref 96–112)
Glucose, Bld: 90 mg/dL (ref 70–99)
Potassium: 3.8 mEq/L (ref 3.7–5.3)
Sodium: 137 mEq/L (ref 137–147)

## 2013-09-24 LAB — URINE CULTURE
Colony Count: NO GROWTH
Culture: NO GROWTH
Special Requests: NORMAL

## 2013-09-24 MED ORDER — CYANOCOBALAMIN 500 MCG PO TABS
500.0000 ug | ORAL_TABLET | Freq: Every day | ORAL | Status: DC
  Administered 2013-09-25: 10:00:00 500 ug via ORAL
  Filled 2013-09-24 (×2): qty 1

## 2013-09-24 MED ORDER — SODIUM CHLORIDE 0.9 % IV SOLN
INTRAVENOUS | Status: DC
  Administered 2013-09-24 – 2013-09-25 (×2): via INTRAVENOUS

## 2013-09-24 MED ORDER — SODIUM CHLORIDE 0.9 % IV BOLUS (SEPSIS): 1000.0000 mL | Freq: Once | INTRAVENOUS | Status: DC

## 2013-09-24 MED ORDER — POLYETHYLENE GLYCOL 3350 17 G PO PACK
17.0000 g | PACK | Freq: Every day | ORAL | Status: DC
  Administered 2013-09-24 – 2013-09-25 (×2): 17 g via ORAL
  Filled 2013-09-24 (×2): qty 1

## 2013-09-24 MED ORDER — OSELTAMIVIR PHOSPHATE 30 MG PO CAPS
30.0000 mg | ORAL_CAPSULE | Freq: Every day | ORAL | Status: DC
  Administered 2013-09-25: 30 mg via ORAL
  Filled 2013-09-24: qty 1

## 2013-09-24 NOTE — Progress Notes (Signed)
Patient's troponin 1.46 at 0250 AM. This is a critical value, was not notified by lab. Patient experiencing no chest pain. Paged [facility], no new orders. Will continue to monitor patient.  Leeroy Cha

## 2013-09-24 NOTE — Progress Notes (Addendum)
Patient had elevated troponin of 1.46 12/29 at 2100, was never notified of this critical value by lab. Patient is not experiencing any chest pain or shortness of breath. Notified [facility], EKG ordered for 6 AM. Will continue to monitor patient.

## 2013-09-24 NOTE — Progress Notes (Signed)
[facility] PROGRESS NOTE  Yolanda Cannon WUJ:[id number] DOB: [birth year] DOA: 09/22/2013 PCP: Gretel Acre, MD   Brief HPI/ Interim history: Presented with  Patient states that the past 2-3 days she been not feeling well. Have been progressively getting fatigued. She was having a cough and started to have some low-grade fevers as well. Her family apparently brought in to emergency department where she was found to be severely orthostatic. She was febrile to 100.3. Chest x-ray did not show any evidence of pneumonia July unremarkable. Patient did have some elevation of BNP she has chronic history of CHF but did not appear to be fluid overloaded. On the contrary patient appears to be dehydrated. Hospitalist was called on admission.   Assessment/Plan: Influenza: - tamiflu and supportive care.   H/o CHF with elevated troponin.  - she was given one dose of IV lasix.  - clinically she appears dehydrated.  - cardiology consulted.  - 2 d Echocardiogram.  - strict I&O - daily weights.  - elevated troponin possibly driven by her resp status.    Mild anemia;  normocytic.   Hypokalemia:  replete as needed.   DVT prophylaxis     Code Status: DNR Family Communication: discussed the plan of care with the patient's daughter and son in law in addition to the patient.  Disposition Plan: possibly tomorrow pending PT EVALUATION.    Consultants:  Cardiology.  Procedures:  Echocardiogram pending.   Antibiotics: none HPI/Subjective: No new complaints.   Objective: Filed Vitals:   09/24/13 1414  BP: 99/57  Pulse:   Temp:   Resp:     Intake/Output Summary (Last 24 hours) at 09/24/13 1532 Last data filed at 09/24/13 1145  Gross per 24 hour  Intake    250 ml  Output    325 ml  Net    -75 ml   Filed Weights   09/22/13 2021 09/23/13 0122 09/24/13 0518  Weight: 50.349 kg (111 lb) 50 kg (110 lb 3.7 oz) 51.6 kg (113 lb 12.1 oz)    Exam:   General:  Alert afebrile  comfortable  Cardiovascular: s1s2  Respiratory: ctab  Abdomen: soft NT NDBS+  Musculoskeletal: no pedal edema.   Data Reviewed: Basic Metabolic Panel:  Recent Labs Lab 09/22/13 2100 09/23/13 0119 09/23/13 0723 09/24/13 0250  NA 137  --  135 137  K 3.2*  --  3.2* 3.8  CL 98  --  97 99  CO2 29  --  27 29  GLUCOSE 110*  --  105* 90  BUN 19  --  20 22  CREATININE 0.98 1.00 0.92 1.03  CALCIUM 8.4  --  8.4 8.4  MG  --   --  1.9  --   PHOS  --   --  4.0  --    Liver Function Tests:  Recent Labs Lab 09/22/13 2100 09/23/13 0723  AST 14 17  ALT 5 6  ALKPHOS 61 64  BILITOT 0.4 0.3  PROT 6.3 6.6  ALBUMIN 3.0* 3.1*   No results found for this basename: LIPASE, AMYLASE,  in the last 168 hours No results found for this basename: AMMONIA,  in the last 168 hours CBC:  Recent Labs Lab 09/22/13 2100 09/23/13 0723 09/24/13 0250  WBC 5.0 6.1 5.8  NEUTROABS 4.1  --   --   HGB 10.8* 11.3* 10.0*  HCT 32.6* 34.4* 31.2*  MCV 91.8 92.2 92.9  PLT 158 155 136*   Cardiac Enzymes:  Recent Labs Lab 09/23/13  1610 09/23/13 0723 09/23/13 1315 09/23/13 2100 09/24/13 0250  TROPONINI <0.30 <0.30 0.51* 1.46* 1.46*   BNP (last 3 results)  Recent Labs  09/22/13 2100  PROBNP 17493.0*   CBG: No results found for this basename: GLUCAP,  in the last 168 hours  Recent Results (from the past 240 hour(s))  CULTURE, BLOOD (ROUTINE X 2)     Status: None   Collection Time    09/22/13  9:00 PM      Result Value Range Status   Specimen Description BLOOD LEFT FOREARM   Final   Special Requests BOTTLES DRAWN AEROBIC AND ANAEROBIC 5CC   Final   Culture  Setup Time     Final   Value: 09/23/2013 00:59     Performed at [facility]   Culture     Final   Value:        BLOOD CULTURE RECEIVED NO GROWTH TO DATE CULTURE WILL BE HELD FOR 5 DAYS BEFORE ISSUING A FINAL NEGATIVE REPORT     Performed at [facility]   Report Status PENDING   Incomplete  CULTURE, BLOOD  (ROUTINE X 2)     Status: None   Collection Time    09/22/13  9:40 PM      Result Value Range Status   Specimen Description BLOOD RIGHT HAND   Final   Special Requests BOTTLES DRAWN AEROBIC AND ANAEROBIC 4CC   Final   Culture  Setup Time     Final   Value: 09/23/2013 00:58     Performed at [facility]   Culture     Final   Value:        BLOOD CULTURE RECEIVED NO GROWTH TO DATE CULTURE WILL BE HELD FOR 5 DAYS BEFORE ISSUING A FINAL NEGATIVE REPORT     Performed at [facility]   Report Status PENDING   Incomplete  URINE CULTURE     Status: None   Collection Time    09/22/13 10:28 PM      Result Value Range Status   Specimen Description URINE, CATHETERIZED   Final   Special Requests Normal   Final   Culture  Setup Time     Final   Value: 09/23/2013 03:04     Performed at [facility] Count     Final   Value: NO GROWTH     Performed at [facility]   Culture     Final   Value: NO GROWTH     Performed at [facility]   Report Status 09/24/2013 FINAL   Final     Studies: Dg Chest 2 View  09/23/2013   CLINICAL DATA:  Cough and fever  EXAM: CHEST  2 VIEW  COMPARISON:  09/22/2013  FINDINGS: Cardiac shadow is stable. Diffuse fibrotic changes are again identified. New density is noted in the left lung base which may represent a very early superimposed infiltrate. No acute bony abnormality is noted.  IMPRESSION: New changes in the left base likely related to acute superimposed infiltrate.   Electronically Signed   By: Alcide Clever M.D.   On: 09/23/2013 07:59   Dg Chest Port 1 View  09/22/2013   CLINICAL DATA:  Cough and fever.  EXAM: PORTABLE CHEST - 1 VIEW  COMPARISON:  05/17/2013  FINDINGS: Normal heart size and pulmonary vascularity. Diffuse interstitial fibrosis and bronchiectasis. Calcification and pleural thickening in the apices. Findings appear similar to previous study in suggest  chronic fibrosis. No focal airspace disease or  consolidation. No blunting of costophrenic angles. No pneumothorax. Calcification of the aorta.  IMPRESSION: Stable appearance of chronic fibrosis and bronchiectasis in the lungs. No superimposed acute infiltration.   Electronically Signed   By: Burman Nieves M.D.   On: 09/22/2013 21:23    Scheduled Meds: . aspirin EC  81 mg Oral Daily  . cephALEXin  250 mg Oral QHS  . docusate sodium  100 mg Oral BID  . dorzolamide-timolol  1 drop Both Eyes BID  . enoxaparin (LOVENOX) injection  30 mg Subcutaneous Q24H  . [START ON 09/25/2013] oseltamivir  30 mg Oral Daily  . pantoprazole  40 mg Oral Q0600  . polyethylene glycol  17 g Oral Daily  . potassium chloride  20 mEq Oral Daily  . sodium chloride  3 mL Intravenous Q12H   Continuous Infusions: . sodium chloride 50 mL/hr at 09/24/13 1145    Active Problems:   Orthostatic hypotension   Dehydration   Febrile illness   Hypokalemia   Anemia    Time spent: 25 minutes    Elsye Mccollister  [facility] Pager [phone] If 7PM-7AM, please contact night-coverage at [email or web address], password [facility] 09/24/2013, 3:32 PM  LOS: 2 days

## 2013-09-24 NOTE — Progress Notes (Signed)
  Echocardiogram 2D Echocardiogram has been performed.  Yolanda Cannon 09/24/2013, 1:25 PM

## 2013-09-24 NOTE — Progress Notes (Signed)
Patient Name: Yolanda Cannon Date of Encounter: 09/24/2013  Active Problems:   Orthostatic hypotension   Dehydration   Febrile illness   Hypokalemia   Anemia   Length of Stay: 2  SUBJECTIVE  The patient feels weak.  CURRENT MEDS . aspirin EC  81 mg Oral Daily  . cephALEXin  250 mg Oral QHS  . docusate sodium  100 mg Oral BID  . dorzolamide-timolol  1 drop Both Eyes BID  . enoxaparin (LOVENOX) injection  30 mg Subcutaneous Q24H  . oseltamivir  75 mg Oral Daily  . pantoprazole  40 mg Oral Q0600  . polyethylene glycol  17 g Oral Daily  . potassium chloride  20 mEq Oral Daily  . sodium chloride  3 mL Intravenous Q12H    OBJECTIVE  Filed Vitals:   09/23/13 2049 09/23/13 2309 09/24/13 0158 09/24/13 0518  BP: 86/48 98/55 94/51  92/47  Pulse: 80 80 87 77  Temp: 98.3 F (36.8 C)  97.9 F (36.6 C) 98.3 F (36.8 C)  TempSrc: Oral  Oral Oral  Resp: 18  18 20   Height:      Weight:    113 lb 12.1 oz (51.6 kg)  SpO2: 100%  98% 99%    Intake/Output Summary (Last 24 hours) at 09/24/13 0748 Last data filed at 09/24/13 0300  Gross per 24 hour  Intake    250 ml  Output    350 ml  Net   -100 ml   Filed Weights   09/22/13 2021 09/23/13 0122 09/24/13 0518  Weight: 111 lb (50.349 kg) 110 lb 3.7 oz (50 kg) 113 lb 12.1 oz (51.6 kg)    PHYSICAL EXAM  General: Pleasant, NAD. Neuro: Alert and oriented X 3. Moves all extremities spontaneously. Psych: Normal affect. HEENT:  Normal  Neck: Supple without bruits or JVD. Lungs:  Resp regular and unlabored, CTA. Heart: RRR no s3, s4, or murmurs. Abdomen: Soft, non-tender, non-distended, BS + x 4.  Extremities: No clubbing, cyanosis or edema. DP/PT/Radials 2+ and equal bilaterally.  Accessory Clinical Findings  CBC  Recent Labs  09/22/13 2100 09/23/13 0723 09/24/13 0250  WBC 5.0 6.1 5.8  NEUTROABS 4.1  --   --   HGB 10.8* 11.3* 10.0*  HCT 32.6* 34.4* 31.2*  MCV 91.8 92.2 92.9  PLT 158 155 136*   Basic Metabolic  Panel  Recent Labs  16/10/96 2100  09/23/13 0723 09/24/13 0250  NA 137  --  135 137  K 3.2*  --  3.2* 3.8  CL 98  --  97 99  CO2 29  --  27 29  GLUCOSE 110*  --  105* 90  BUN 19  --  20 22  CREATININE 0.98  < > 0.92 1.03  CALCIUM 8.4  --  8.4 8.4  MG  --   --  1.9  --   PHOS  --   --  4.0  --   < > = values in this interval not displayed. Liver Function Tests  Recent Labs  09/22/13 2100 09/23/13 0723  AST 14 17  ALT 5 6  ALKPHOS 61 64  BILITOT 0.4 0.3  PROT 6.3 6.6  ALBUMIN 3.0* 3.1*   No results found for this basename: LIPASE, AMYLASE,  in the last 72 hours Cardiac Enzymes  Recent Labs  09/23/13 1315 09/23/13 2100 09/24/13 0250  TROPONINI 0.51* 1.46* 1.46*   BNP No components found with this basename: POCBNP,  D-Dimer No results found for  this basename: DDIMER,  in the last 72 hours Hemoglobin A1C No results found for this basename: HGBA1C,  in the last 72 hours Fasting Lipid Panel No results found for this basename: CHOL, HDL, LDLCALC, TRIG, CHOLHDL, LDLDIRECT,  in the last 72 hours Thyroid Function Tests  Recent Labs  09/23/13 0723  TSH 1.859    Radiology/Studies  Dg Chest 2 View  09/23/2013   CLINICAL DATA:  Cough and fever  EXAM: CHEST  2 VIEW  COMPARISON:  09/22/2013  FINDINGS: Cardiac shadow is stable. Diffuse fibrotic changes are again identified. New density is noted in the left lung base which may represent a very early superimposed infiltrate. No acute bony abnormality is noted.  IMPRESSION: New changes in the left base likely related to acute superimposed infiltrate.   Electronically Signed   By: Alcide Clever M.D.   On: 09/23/2013 07:59   TELE  SR, HR 90'    ASSESSMENT AND PLAN  79 yo WF with the flu. She is orthostatic and dry on exam. She carries a h/o CHF but is not volume overloaded on exam. Her mildly elevated troponin is likely due to "demand ischemia" related to having the flu. Not increasing any more. We will continue to  monitor. I do not think that she has an acute coronary syndrome and would not advise anticoagulation or further CV testing given her advanced age.  My recs are to treat supportively. Hold diuretics and gently hydrate until no longer orthostatic.  Echo is still pending, we will follow.  She is not a candidate for invasive CV procedures.   Signed, Tobias Alexander, H MD, [facility] 09/24/2013

## 2013-09-24 NOTE — Progress Notes (Signed)
Spoke with pt, son in law at bedside concerning home health. Both son in law and pt asked that Dewayne Hatch, pt's daughter be called. Dewayne Hatch was called [phone] home), cell [phone], she asked for [facility], however a call to [facility] revealed that they are unable to see the pt until next week. Ann then selected [facility].  Referral given to [facility] in house rep.

## 2013-09-24 NOTE — Progress Notes (Signed)
During vital sign check patients oxygen saturation was in the mid 80s; pt denied feeling short of breath or labored breathing; pt was placed on 2.5 liters oxygen nasal cannula, sats quickly returned to mid 90s; will continue to monitor closely

## 2013-09-25 DIAGNOSIS — J1100 Influenza due to unidentified influenza virus with unspecified type of pneumonia: Secondary | ICD-10-CM

## 2013-09-25 DIAGNOSIS — J09X1 Influenza due to identified novel influenza A virus with pneumonia: Secondary | ICD-10-CM | POA: Diagnosis not present

## 2013-09-25 DIAGNOSIS — I2489 Other forms of acute ischemic heart disease: Secondary | ICD-10-CM

## 2013-09-25 DIAGNOSIS — I248 Other forms of acute ischemic heart disease: Secondary | ICD-10-CM | POA: Diagnosis present

## 2013-09-25 DIAGNOSIS — I5042 Chronic combined systolic (congestive) and diastolic (congestive) heart failure: Secondary | ICD-10-CM

## 2013-09-25 MED ORDER — OSELTAMIVIR PHOSPHATE 30 MG PO CAPS: 30.0000 mg | ORAL_CAPSULE | Freq: Every day | ORAL | Status: DC

## 2013-09-25 MED ORDER — ACETAMINOPHEN 325 MG PO TABS: 650.0000 mg | ORAL_TABLET | Freq: Four times a day (QID) | ORAL | Status: AC | PRN

## 2013-09-25 NOTE — Progress Notes (Signed)
Pt discharged home via family; Pt and family given and explained all discharge instructions, carenotes, and prescriptions; pt and family stated understanding and denied questions/concerns; all f/u appointments in place; IV removed without complicaitons; pt stable at time of discharge   Pt son and daughter at bedside; extensive info given to both parties; home health is arranged for patient; both stated understanding

## 2013-09-25 NOTE — Progress Notes (Signed)
Spoke with pt, daughter Dewayne Hatch, son Annette Stable, son in Product/process development scientist at bedside concerning HH/SNF. PT notes revealed pt can ambulate 140'. CSW asked to come in also to answer question concerning SNF. Pt will go home with HH.

## 2013-09-25 NOTE — Progress Notes (Signed)
Pharmacist Heart Failure Core Measure Documentation  Assessment: Yolanda Cannon has an EF documented as 15% on 09/24/13 by ECHO.  Rationale: Heart failure patients with left ventricular systolic dysfunction (LVSD) and an EF < 40% should be prescribed an angiotensin converting enzyme inhibitor (ACEI) or angiotensin receptor blocker (ARB) at discharge unless a contraindication is documented in the medical record.  This patient is not currently on an ACEI or ARB for HF.  This note is being placed in the record in order to provide documentation that a contraindication to the use of these agents is present for this encounter.  ACE Inhibitor or Angiotensin Receptor Blocker is contraindicated (specify all that apply)  []   ACEI allergy AND ARB allergy []   Angioedema []   Moderate or severe aortic stenosis []   Hyperkalemia [x]   Hypotension (latest BP 100/50) []   Renal artery stenosis []   Worsening renal function, preexisting renal disease or dysfunction   Dannielle Huh 09/25/2013 12:22 PM

## 2013-09-25 NOTE — Discharge Summary (Signed)
Physician Discharge Summary  Yolanda Cannon AVW:[id number] DOB: [birth year] DOA: 09/22/2013  PCP: Gretel Acre, MD  Admit date: 09/22/2013 Discharge date: 09/25/2013  Recommendations for Outpatient Follow-up:  1. Followup with PCP in 1-2 weeks. 2. Followup final blood culture results, negative to date.  Discharge Diagnoses:  Principal Problem:    Influenza A with pneumonia Active Problems:    Orthostatic hypotension    Dehydration    Hypokalemia    Anemia    Chronic combined systolic and diastolic CHF (congestive heart failure)    Demand ischemia  Discharge Condition: Improved.  Diet recommendation: Low-sodium, heart healthy.  History of present illness:  Yolanda Cannon is an 79 y.o. female with a PMH of CHF, unknown EF, was admitted to the hospital 09/23/2013 with fever, fatigue, and cough. Initial evaluation in the ER showed an elevated BNP.  Hospital Course by problem:  Principal Problem:  Influenza A with pneumonia  The patient was admitted and influenza panel PCR was positive for influenza A. She was placed on empiric Tamiflu. Left lower lobe infiltrate noted on chest radiograph. We'll discharge home on an additional 3 days of treatment with Tamiflu. Active Problems:  Orthostatic hypotension / Dehydration  Gently hydrated.  Hypokalemia  Corrected.  Normocytic Anemia  Hemoglobin stable with no current indication for transfusion.  Chronic systolic and diastolic CHF (congestive heart failure) / demand ischemia  Patient initially noted to be dehydrated/dry on exam. Given positive troponins, cardiology was consulted and felt that her elevated troponins were from demand ischemia in the setting of influenza. 2-D echocardiogram done 09/24/2013 showed an EF of 15%, diffuse hypokinesis, restrictive left ventricular relaxation (grade 3 diastolic dysfunction). No further workup recommended by cardiology. Not felt to be a candidate for invasive cardiovascular  procedures.   Procedures:  2-D echocardiogram 09/24/2013: EF 15%, diffuse hypokinesis, restrictive left ventricular relaxation (grade 3 diastolic dysfunction).  Consultations:  Dr. Hillis Range, Cardiology   Discharge Exam: Filed Vitals:   09/25/13 0514  BP: 100/50  Pulse: 88  Temp: 98.4 F (36.9 C)  Resp: 18   Filed Vitals:   09/24/13 2035 09/25/13 0034 09/25/13 0406 09/25/13 0514  BP: 97/49   100/50  Pulse: 88   88  Temp: 97.9 F (36.6 C) 98.3 F (36.8 C)  98.4 F (36.9 C)  TempSrc: Oral Oral  Oral  Resp: 18   18  Height:      Weight:   52.4 kg (115 lb 8.3 oz)   SpO2: 100%   100%    Gen:  NAD Cardiovascular:  RRR, No M/R/G Respiratory: Lungs CTAB Gastrointestinal: Abdomen soft, NT/ND with normal active bowel sounds. Extremities: No C/E/C   Discharge Instructions  Discharge Orders   Future Orders Complete By Expires   (HEART FAILURE PATIENTS) Call MD:  Anytime you have any of the following symptoms: 1) 3 pound weight gain in 24 hours or 5 pounds in 1 week 2) shortness of breath, with or without a dry hacking cough 3) swelling in the hands, feet or stomach 4) if you have to sleep on extra pillows at night in order to breathe.  As directed    Diet - low sodium heart healthy  As directed    Increase activity slowly  As directed        Medication List         acetaminophen 325 MG tablet  Commonly known as:  TYLENOL  Take 2 tablets (650 mg total) by mouth every 6 (six) hours as needed  for mild pain (or Fever >/= 101).     aspirin EC 81 MG tablet  Take 81 mg by mouth daily.     cephALEXin 250 MG capsule  Commonly known as:  KEFLEX  Take 250 mg by mouth at bedtime.     cholecalciferol 1000 UNITS tablet  Commonly known as:  VITAMIN D  Take 1,000 Units by mouth daily.     dorzolamide-timolol 22.3-6.8 MG/ML ophthalmic solution  Commonly known as:  COSOPT  Place 1 drop into both eyes 2 (two) times daily.     furosemide 20 MG tablet  Commonly known as:   LASIX  Take 60 mg by mouth daily.     oseltamivir 30 MG capsule  Commonly known as:  TAMIFLU  Take 1 capsule (30 mg total) by mouth daily.     potassium chloride 20 MEQ/15ML (10%) solution  Take 20 mEq by mouth daily.           Follow-up Information   Follow up with NNODI, ADAKU, MD. Schedule an appointment as soon as possible for a visit in 2 weeks. [facility] follow up)    Specialty:  Family Medicine   Contact information:   [address] [place] [address] [phone]        The results of significant diagnostics from this hospitalization (including imaging, microbiology, ancillary and laboratory) are listed below for reference.    Significant Diagnostic Studies: Dg Chest 2 View  09/23/2013   CLINICAL DATA:  Cough and fever  EXAM: CHEST  2 VIEW  COMPARISON:  09/22/2013  FINDINGS: Cardiac shadow is stable. Diffuse fibrotic changes are again identified. New density is noted in the left lung base which may represent a very early superimposed infiltrate. No acute bony abnormality is noted.  IMPRESSION: New changes in the left base likely related to acute superimposed infiltrate.   Electronically Signed   By: Alcide Clever M.D.   On: 09/23/2013 07:59   Dg Chest Port 1 View  09/22/2013   CLINICAL DATA:  Cough and fever.  EXAM: PORTABLE CHEST - 1 VIEW  COMPARISON:  05/17/2013  FINDINGS: Normal heart size and pulmonary vascularity. Diffuse interstitial fibrosis and bronchiectasis. Calcification and pleural thickening in the apices. Findings appear similar to previous study in suggest chronic fibrosis. No focal airspace disease or consolidation. No blunting of costophrenic angles. No pneumothorax. Calcification of the aorta.  IMPRESSION: Stable appearance of chronic fibrosis and bronchiectasis in the lungs. No superimposed acute infiltration.   Electronically Signed   By: Burman Nieves M.D.   On: 09/22/2013 21:23    Labs:  Basic Metabolic Panel:  Recent Labs Lab  09/22/13 2100 09/23/13 0119 09/23/13 0723 09/24/13 0250  NA 137  --  135 137  K 3.2*  --  3.2* 3.8  CL 98  --  97 99  CO2 29  --  27 29  GLUCOSE 110*  --  105* 90  BUN 19  --  20 22  CREATININE 0.98 1.00 0.92 1.03  CALCIUM 8.4  --  8.4 8.4  MG  --   --  1.9  --   PHOS  --   --  4.0  --    GFR Estimated Creatinine Clearance: 26.3 ml/min (by C-G formula based on Cr of 1.03). Liver Function Tests:  Recent Labs Lab 09/22/13 2100 09/23/13 0723  AST 14 17  ALT 5 6  ALKPHOS 61 64  BILITOT 0.4 0.3  PROT 6.3 6.6  ALBUMIN 3.0* 3.1*  CBC:  Recent Labs Lab 09/22/13 2100 09/23/13 0723 09/24/13 0250  WBC 5.0 6.1 5.8  NEUTROABS 4.1  --   --   HGB 10.8* 11.3* 10.0*  HCT 32.6* 34.4* 31.2*  MCV 91.8 92.2 92.9  PLT 158 155 136*   Cardiac Enzymes:  Recent Labs Lab 09/23/13 0119 09/23/13 0723 09/23/13 1315 09/23/13 2100 09/24/13 0250  TROPONINI <0.30 <0.30 0.51* 1.46* 1.46*   Thyroid function studies  Recent Labs  09/23/13 0723  TSH 1.859   Anemia work up  Recent Labs  09/23/13 0217  VITAMINB12 223  FOLATE 12.4  FERRITIN 86  TIBC 213*  IRON 10*  RETICCTPCT 0.8   Microbiology Recent Results (from the past 240 hour(s))  CULTURE, BLOOD (ROUTINE X 2)     Status: None   Collection Time    09/22/13  9:00 PM      Result Value Range Status   Specimen Description BLOOD LEFT FOREARM   Final   Special Requests BOTTLES DRAWN AEROBIC AND ANAEROBIC 5CC   Final   Culture  Setup Time     Final   Value: 09/23/2013 00:59     Performed at [facility]   Culture     Final   Value:        BLOOD CULTURE RECEIVED NO GROWTH TO DATE CULTURE WILL BE HELD FOR 5 DAYS BEFORE ISSUING A FINAL NEGATIVE REPORT     Performed at [facility]   Report Status PENDING   Incomplete  CULTURE, BLOOD (ROUTINE X 2)     Status: None   Collection Time    09/22/13  9:40 PM      Result Value Range Status   Specimen Description BLOOD RIGHT HAND   Final   Special Requests  BOTTLES DRAWN AEROBIC AND ANAEROBIC 4CC   Final   Culture  Setup Time     Final   Value: 09/23/2013 00:58     Performed at [facility]   Culture     Final   Value:        BLOOD CULTURE RECEIVED NO GROWTH TO DATE CULTURE WILL BE HELD FOR 5 DAYS BEFORE ISSUING A FINAL NEGATIVE REPORT     Performed at [facility]   Report Status PENDING   Incomplete  URINE CULTURE     Status: None   Collection Time    09/22/13 10:28 PM      Result Value Range Status   Specimen Description URINE, CATHETERIZED   Final   Special Requests Normal   Final   Culture  Setup Time     Final   Value: 09/23/2013 03:04     Performed at [facility] Count     Final   Value: NO GROWTH     Performed at [facility]   Culture     Final   Value: NO GROWTH     Performed at [facility]   Report Status 09/24/2013 FINAL   Final    Time coordinating discharge: 35 minutes.  Signed:  RAMA,CHRISTINA  Pager [phone] [facility] 09/25/2013, 3:36 PM

## 2013-09-25 NOTE — Progress Notes (Signed)
CSW & RNCM spoke with patient, daughter, son-in-law & son at bedside re: discharge plan. RNCM reviewed PT evaluation from 12/29 indicating that patient was able to ambulate 140' with rolling walker and min assist. Patient & family plan for patient to return home with home health - RNCM arranging.   CSW informed patient & family that if they get home and then decide that SNF would be better, they could arrange from home. CSW provided patient/family with list of [facility].   No further CSW needs identified - CSW signing off.    Unice Bailey, LCSW [facility] Clinical Social Worker cell #: [phone]

## 2013-09-29 LAB — CULTURE, BLOOD (ROUTINE X 2)
Culture: NO GROWTH
Culture: NO GROWTH

## 2013-10-01 ENCOUNTER — Telehealth: Payer: Self-pay | Admitting: Interventional Cardiology

## 2013-10-01 NOTE — Telephone Encounter (Signed)
Spoke with Brayton Caves at [facility] and Dr. Ihor Dow is following her there. Pt has graduated from hospice twice. SHould we schedule follow up for pt. She was seen in ED for Orthostatic hypotension, dehydration, flu and pneumonia. Pt is taking Lasix 60 mg daily. Resting Bp 100/52 and standing 84/50's.

## 2013-10-01 NOTE — Telephone Encounter (Signed)
New problem   Need to know if [facility] need to relay all heart related question to pcp since pt hasn;t been seen in our office. Please advise

## 2013-10-08 NOTE — Telephone Encounter (Signed)
Can decrease Lasix to 40 mg daily. Catcher Dehoyos S.

## 2013-10-09 NOTE — Telephone Encounter (Signed)
Spoke with pts daughter and pt is already taking 40mg  of lasix daily. Dr. Ihor Dow decreased the lasix from 60mg  to 40mg  at the beginning of the year and pt has had labs and chest xrays as well. Pt seems to be doing okay on that dosage. Pt has appt on 10/30/13.

## 2013-10-10 ENCOUNTER — Encounter: Payer: Medicare Other | Admitting: Interventional Cardiology

## 2013-10-22 ENCOUNTER — Encounter: Payer: Self-pay | Admitting: *Deleted

## 2013-10-22 ENCOUNTER — Encounter: Payer: Self-pay | Admitting: Interventional Cardiology

## 2013-10-22 DIAGNOSIS — M810 Age-related osteoporosis without current pathological fracture: Secondary | ICD-10-CM | POA: Insufficient documentation

## 2013-10-22 DIAGNOSIS — G629 Polyneuropathy, unspecified: Secondary | ICD-10-CM | POA: Insufficient documentation

## 2013-10-23 ENCOUNTER — Other Ambulatory Visit: Payer: Self-pay | Admitting: Family Medicine

## 2013-10-23 DIAGNOSIS — R110 Nausea: Secondary | ICD-10-CM

## 2013-10-28 ENCOUNTER — Other Ambulatory Visit: Payer: Medicare Other

## 2013-10-29 ENCOUNTER — Other Ambulatory Visit: Payer: Self-pay | Admitting: Gastroenterology

## 2013-10-29 DIAGNOSIS — R1032 Left lower quadrant pain: Secondary | ICD-10-CM

## 2013-10-30 ENCOUNTER — Encounter: Payer: Self-pay | Admitting: Interventional Cardiology

## 2013-10-30 ENCOUNTER — Ambulatory Visit (INDEPENDENT_AMBULATORY_CARE_PROVIDER_SITE_OTHER): Payer: Medicare Other | Admitting: Interventional Cardiology

## 2013-10-30 VITALS — BP 107/70 | HR 108 | Ht 61.0 in | Wt 102.8 lb

## 2013-10-30 DIAGNOSIS — I5042 Chronic combined systolic (congestive) and diastolic (congestive) heart failure: Secondary | ICD-10-CM

## 2013-10-30 DIAGNOSIS — I248 Other forms of acute ischemic heart disease: Secondary | ICD-10-CM

## 2013-10-30 DIAGNOSIS — I428 Other cardiomyopathies: Secondary | ICD-10-CM

## 2013-10-30 DIAGNOSIS — I509 Heart failure, unspecified: Secondary | ICD-10-CM

## 2013-10-30 MED ORDER — FUROSEMIDE 20 MG PO TABS: 40.0000 mg | ORAL_TABLET | Freq: Every day | ORAL | Status: AC

## 2013-10-30 NOTE — Patient Instructions (Addendum)
Your physician recommends that you schedule a follow-up appointment as needed.   Your physician has recommended you make the following change in your medication:  1. Hold Lasix tomorrow and then use lasix based on breathing status and weight. Can hold once a week if needed.

## 2013-10-30 NOTE — Progress Notes (Signed)
Patient ID: Yolanda Cannon, female   DOB: Aug 15, 1919, 80 y.o.   MRN: [id number]    [address] [place], [place]  [address] Phone: [phone] Fax:  [phone]  Date:  10/30/2013   ID:  Yolanda Cannon, DOB March 16, 1919, MRN [id number]  PCP:  Gretel Acre, MD      History of Present Illness: Yolanda Cannon is a 80 y.o. female who had normal LV function in 2009. She suffered a fall last year. She remained sore in her right chest and saw her PMD. A BNP was found to be elevated and there was a question of early interstitial edema. SHe has not had orthopnea, SHOB, PND, edema, or left sided chest pain.  Her EF was found to be decreased. SHe has been started on Lasix and referred to hospice. No cardiac symptoms at that time.   She graduated from hospice and was re- referred.  She graduated (she was doing so well. She was hospitalized in December for the flu. Since that time, she has lost about 15-20 pounds. She has no appetite. She eats a little bit of fruit everyday. He does drink a lot of coffee. She is not drinking a lot of juice or Gatorade. She tries to have boost drinks.    Wt Readings from Last 3 Encounters:  10/30/13 102 lb 12.8 oz (46.63 kg)  09/25/13 115 lb 8.3 oz (52.4 kg)     Past Medical History  Diagnosis Date  . CHF (congestive heart failure)   . Hyperlipidemia   . HTN (hypertension)      mild LVH (echo 2009 Dr. Alanda Amass), ejection fraction 50-55%, cardiologist is Dr. Elsie Lincoln  . Peripheral neuropathy   . Osteoporosis   . Hypercholesteremia   . Demand ischemia   . Orthostatic hypotension   . Anemia   . Hypokalemia     Current Outpatient Prescriptions  Medication Sig Dispense Refill  . acetaminophen (TYLENOL) 325 MG tablet Take 2 tablets (650 mg total) by mouth every 6 (six) hours as needed for mild pain (or Fever >/= 101).      Marland Kitchen aspirin EC 81 MG tablet Take 81 mg by mouth daily.      . cephALEXin (KEFLEX) 250 MG capsule Take 250 mg by mouth at bedtime.         . cholecalciferol (VITAMIN D) 1000 UNITS tablet Take 1,000 Units by mouth daily.      . dorzolamide-timolol (COSOPT) 22.3-6.8 MG/ML ophthalmic solution Place 1 drop into both eyes 2 (two) times daily.      . furosemide (LASIX) 20 MG tablet Take 40 mg by mouth daily.       . meclizine (ANTIVERT) 25 MG tablet Take 25 mg by mouth 3 (three) times daily as needed for dizziness.      . mirtazapine (REMERON) 15 MG tablet Take 15 mg by mouth at bedtime.      Marland Kitchen omeprazole (PRILOSEC) 20 MG capsule Take 20 mg by mouth daily.      . potassium chloride 20 MEQ/15ML (10%) solution Take 20 mEq by mouth daily.       No current facility-administered medications for this visit.    Allergies:   No Known Allergies  Social History:  The patient  reports that she has quit smoking. She does not have any smokeless tobacco history on file. She reports that she does not drink alcohol or use illicit drugs.   Family History:  The patient's family history includes Other  in an other family member.   ROS:  Please see the history of present illness.  No nausea, vomiting.  No fevers, chills.  No focal weakness.  No dysuria.    All other systems reviewed and negative.   PHYSICAL EXAM: VS:  BP 107/70  Pulse 108  Ht 5\' 1"  (1.549 m)  Wt 102 lb 12.8 oz (46.63 kg)  BMI 19.43 kg/m2 Well nourished, well developed, in no acute distress HEENT: normal Neck: no JVD, no carotid bruits Cardiac:  normal S1, S2; RRR; tachycardic initially, heart rate came down to 90 at the end of the visit Lungs:  clear to auscultation bilaterally, no wheezing, rhonchi or rales Abd: soft, nontender, no hepatomegaly Ext: no edema Skin: warm and dry Neuro:   no focal abnormalities noted   ASSESSMENT AND PLAN:  CHF  Continue Furosemide Tablet, 40 MG, 1 tablet, Orally, Once a day, 30 day(s), 30, Refills 11. Notes: No clinical sx of CHF she may be a little dehydrated at this point due to her lack of by mouth intake. Basedow hold Lasix for a day.  The family can also hold Lasix one day a week as needed if her urine output drops or weight continues to decrease. SHe does not appear SHOB. No problems lying flat.   BNP is elevated in the past. She now has known systolic dysfunction. she has minimal symptoms.I reviewed her echocardiogram from the hospital. Her ejection fraction is dropped to 15%.     She has been  told by hospice that she is doing so well, she  was discharged from their program.   I think the weight loss is an ominous symptom. Referral back to hospice is reasonable. I stressed the importance of by mouth intake.   She was started on Remeron for her appetite.   Adjust Lasix dose. Left ventricular ejection fraction is worsening. Intermittent tachycardia is also an ominous sign. No florid symptoms at this point.  She'll continue to be evaluated by home health. This may end soon. If this does, would prefer back to hospice. I discussed this with the patient's son.  Elevated troponin in the hospital, likely related to demand ischemia. She is not a candidate for any type of ischemia workup.   Signed, Fredric MareJay S. Varanasi, MD, [facility] 10/30/2013 12:22 PM

## 2013-11-04 ENCOUNTER — Ambulatory Visit
Admission: RE | Admit: 2013-11-04 | Discharge: 2013-11-04 | Disposition: A | Discharge status: Home / Self Care | Payer: Medicare Other | Source: Ambulatory Visit | Attending: Gastroenterology | Admitting: Gastroenterology

## 2013-11-04 DIAGNOSIS — R1032 Left lower quadrant pain: Secondary | ICD-10-CM

## 2014-01-21 ENCOUNTER — Other Ambulatory Visit: Payer: Self-pay | Admitting: Family Medicine

## 2014-01-21 ENCOUNTER — Ambulatory Visit
Admission: RE | Admit: 2014-01-21 | Discharge: 2014-01-21 | Disposition: A | Discharge status: Home / Self Care | Payer: Medicare Other | Source: Ambulatory Visit | Attending: Family Medicine | Admitting: Family Medicine

## 2014-01-21 DIAGNOSIS — R0602 Shortness of breath: Secondary | ICD-10-CM

## 2014-01-21 DIAGNOSIS — M79601 Pain in right arm: Secondary | ICD-10-CM

## 2014-01-21 DIAGNOSIS — R05 Cough: Secondary | ICD-10-CM

## 2014-01-21 DIAGNOSIS — R059 Cough, unspecified: Secondary | ICD-10-CM

## 2014-03-26 DEATH — deceased

## 2015-12-25 IMAGING — CR DG CHEST 2V
2 series · 2 of 2 positions shown · non-contrast
Comparison: Chest x-ray of 10/07/2013

CLINICAL DATA: Cough, shortness breath and congestion

EXAM:
CHEST  2 VIEW

[view not recorded (1 of 2)]
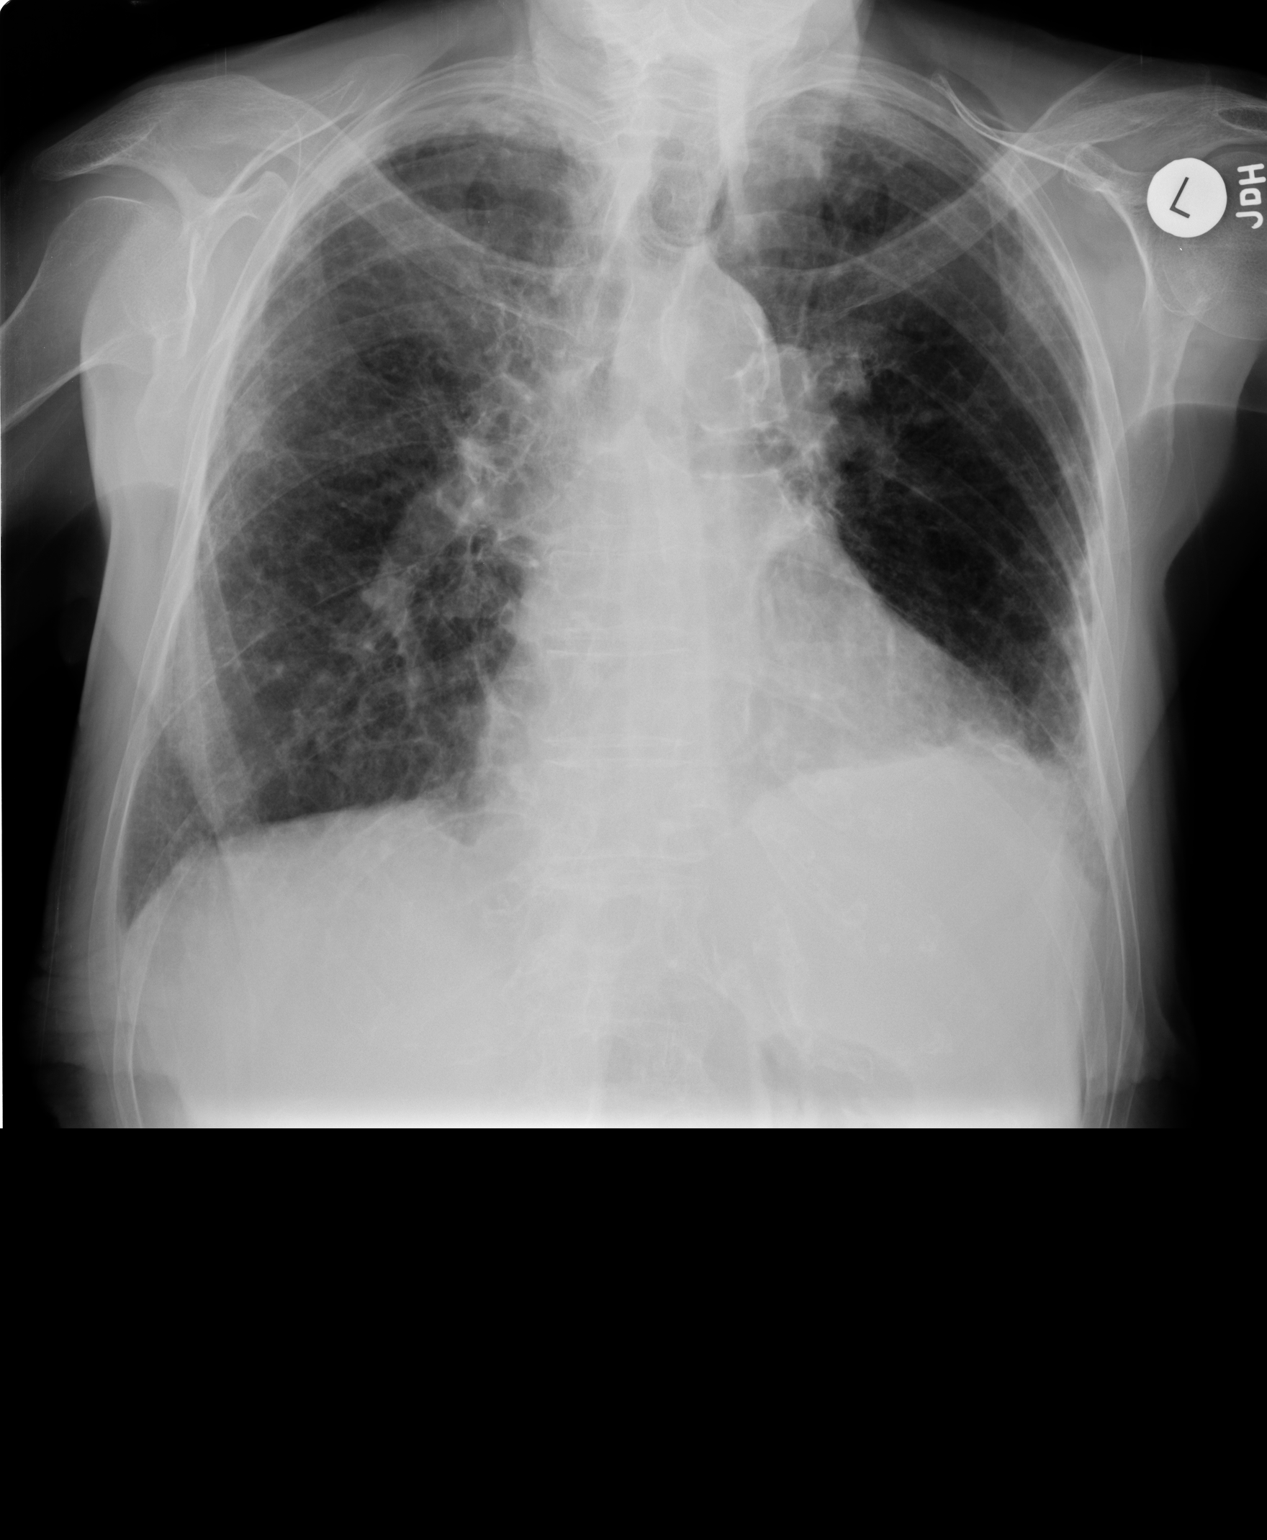

[view not recorded (2 of 2)]
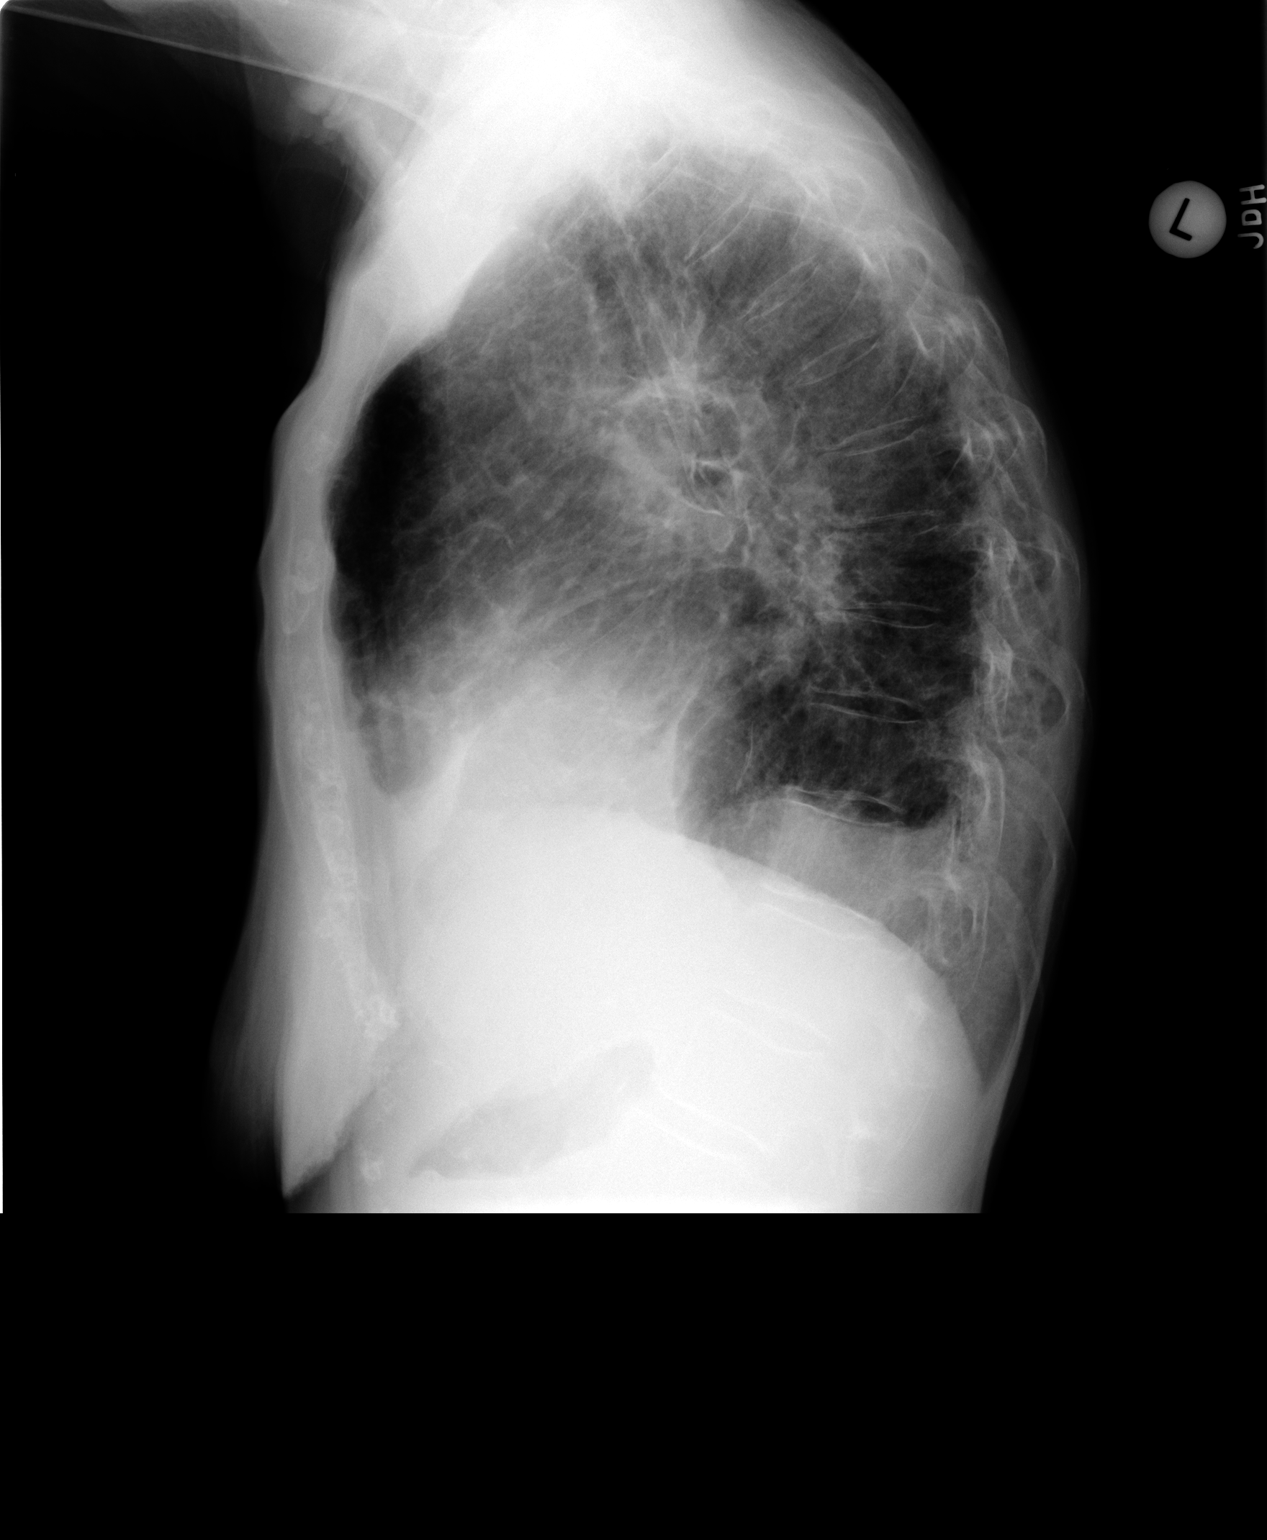

[2 of 2 positions shown; findings below may reference images not displayed]

FINDINGS: There are prominent perihilar markings with peribronchial thickening
most consistent with bronchitis. No definite pneumonia is seen.
Chronic changes remain at the lung bases. Cardiomegaly is stable.
Biapical pleural parenchymal scarring is unchanged. The bones are
osteopenic and there is a thoracic kyphosis present. Focal
eventration of the posterior left hemidiaphragm again is noted.
IMPRESSION: Prominent perihilar markings may indicate bronchitis. No definite
pneumonia. Chronic interstitial change again noted.

## 2015-12-25 IMAGING — CR DG HUMERUS 2V *R*
3 series · 3 of 3 positions shown · non-contrast
Comparison: None.

CLINICAL DATA: Right shoulder pain to the elbow, questionable
recent fall

EXAM:
RIGHT HUMERUS - 2+ VIEW

[view not recorded (1 of 3)]
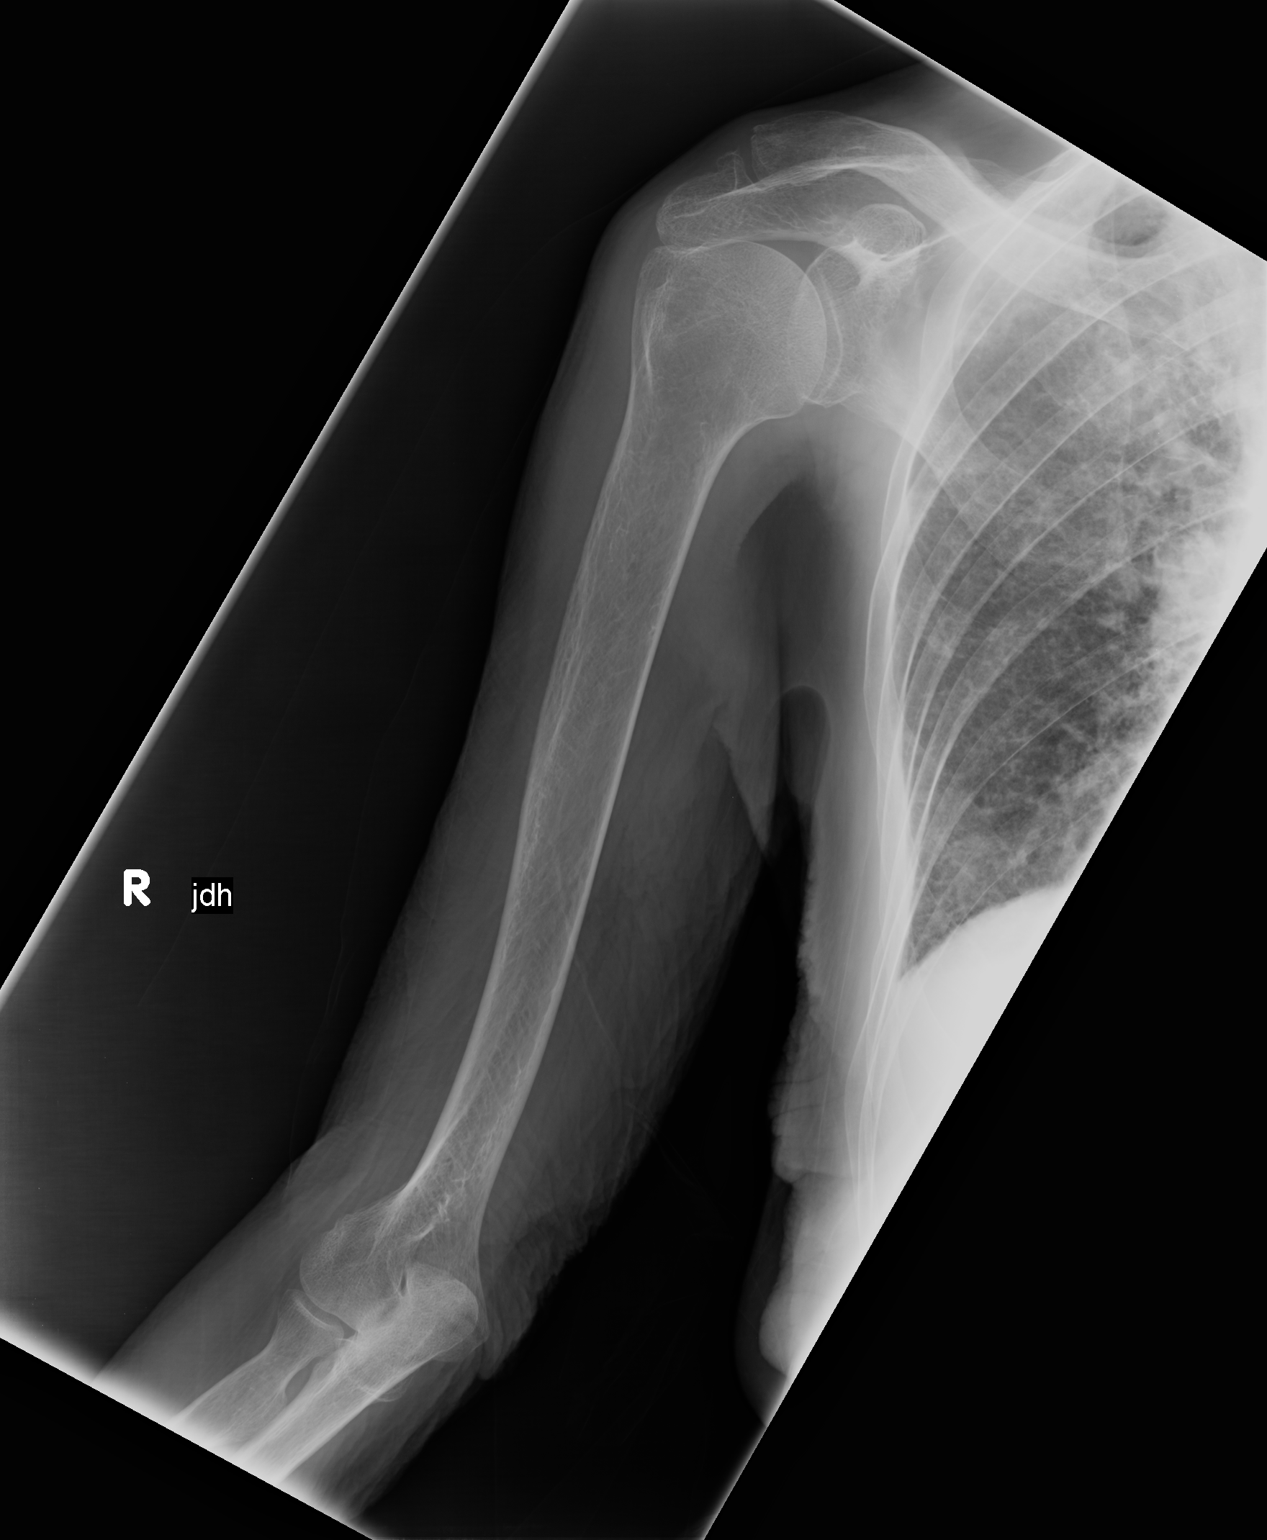

[view not recorded (2 of 3)]
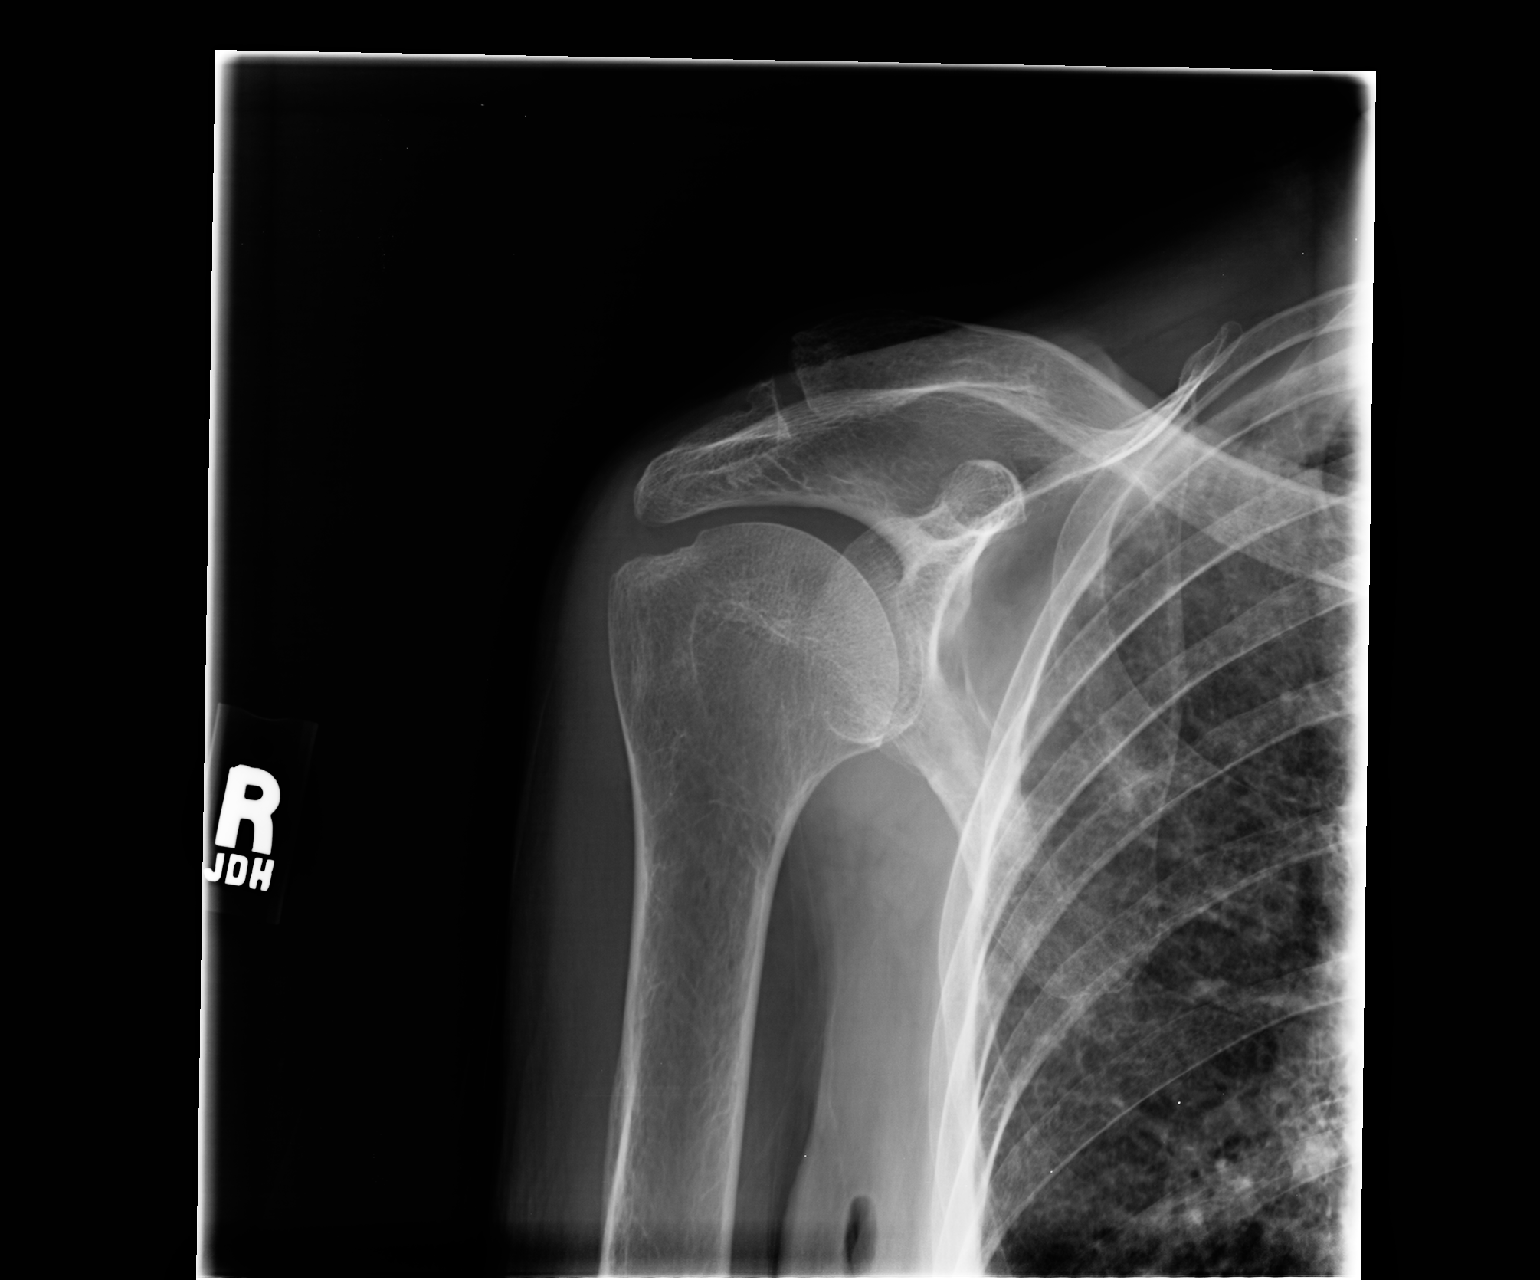

[view not recorded (3 of 3)]
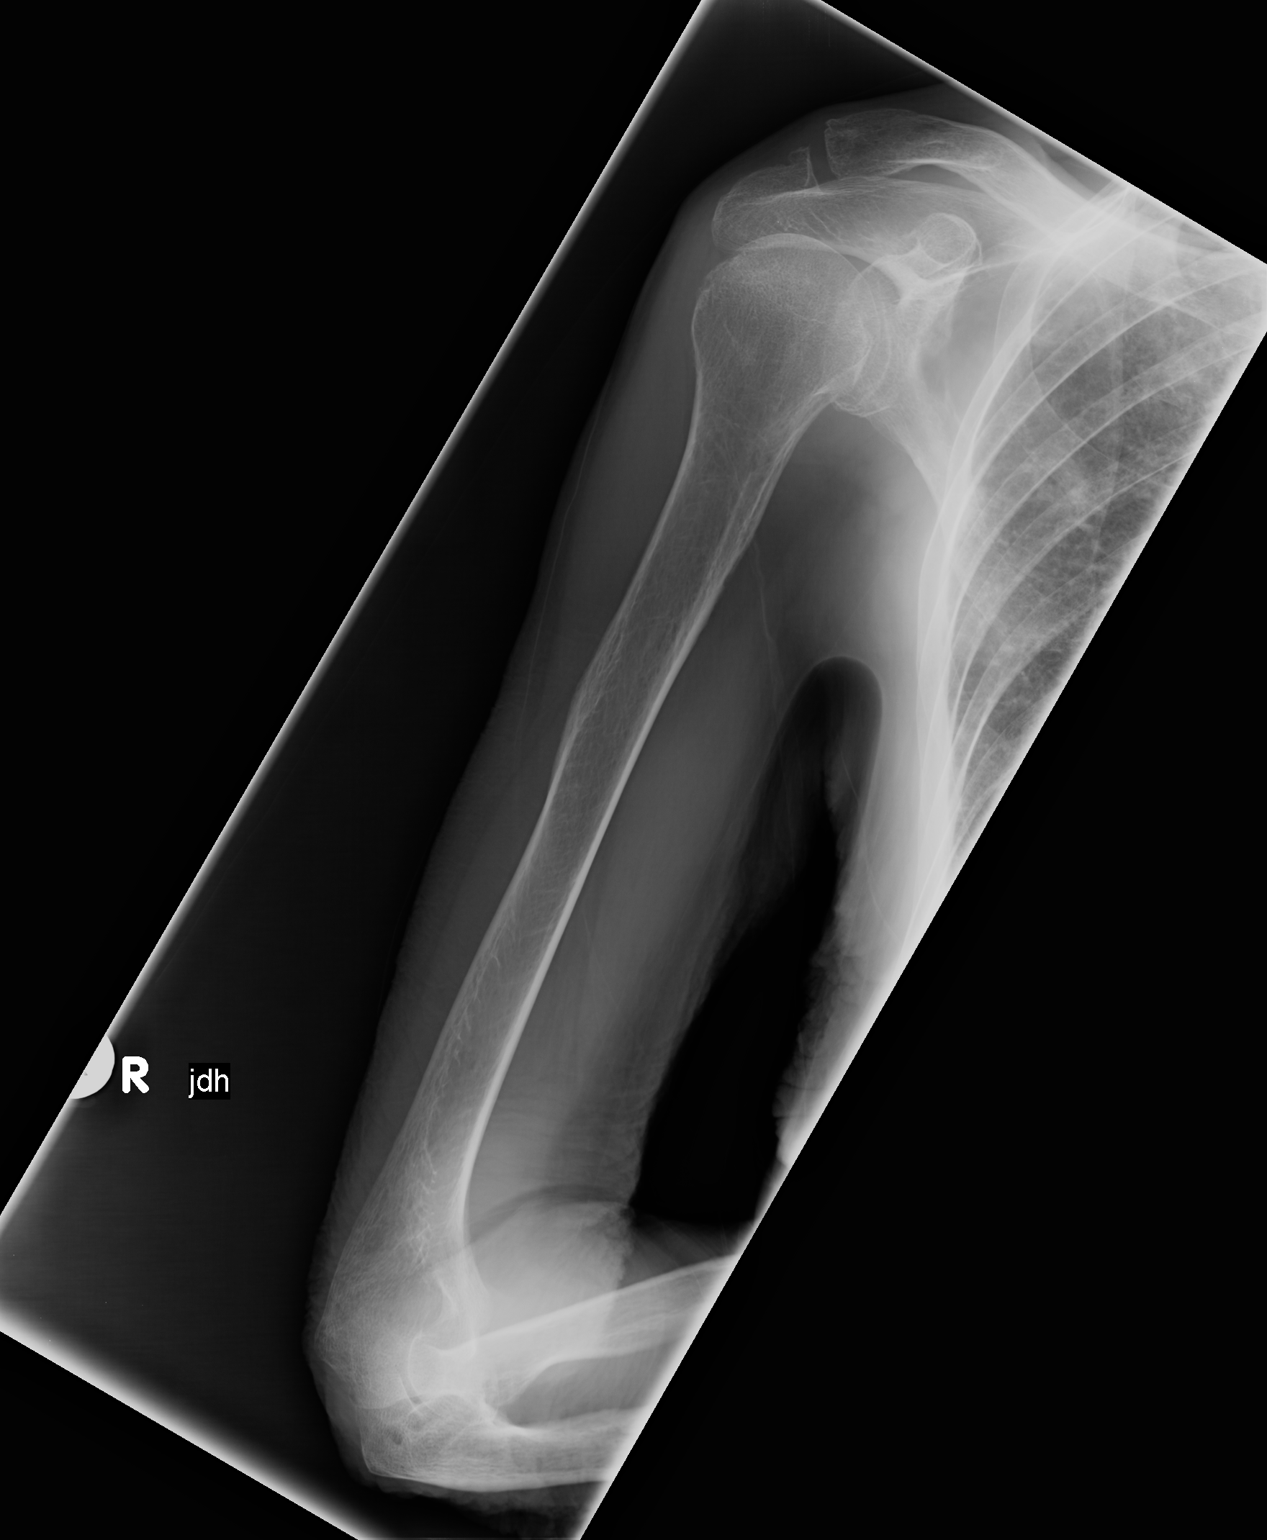

[3 of 3 positions shown; findings below may reference images not displayed]

FINDINGS: No definite acute fracture is seen. Slight irregularity at the
insertion of the rotator cuff may indicate chronic rotator cuff
disease. The right humerus is intact.
IMPRESSION: No acute fracture.  Possible chronic rotator cuff disease.
